# Patient Record
Sex: Female | Born: 1935 | Race: White | Hispanic: No | Smoking: Never smoker
Health system: Southern US, Community
[De-identification: ages and names within clinical notes are randomized; demographics above are authoritative.]

## PROBLEM LIST (undated history)

## (undated) DIAGNOSIS — E43 Unspecified severe protein-calorie malnutrition: Secondary | ICD-10-CM

## (undated) DIAGNOSIS — Z515 Encounter for palliative care: Principal | ICD-10-CM

## (undated) DIAGNOSIS — E46 Unspecified protein-calorie malnutrition: Secondary | ICD-10-CM

## (undated) DIAGNOSIS — R413 Other amnesia: Secondary | ICD-10-CM

## (undated) DIAGNOSIS — H9190 Unspecified hearing loss, unspecified ear: Secondary | ICD-10-CM

## (undated) HISTORY — DX: Unspecified severe protein-calorie malnutrition: E43

## (undated) HISTORY — DX: Encounter for palliative care: Z51.5

## (undated) HISTORY — PX: ABDOMINAL HYSTERECTOMY: SUR658

## (undated) HISTORY — DX: Unspecified protein-calorie malnutrition: E46

## (undated) HISTORY — DX: Other amnesia: R41.3

---

## 2007-03-10 ENCOUNTER — Other Ambulatory Visit: Payer: Self-pay

## 2007-03-11 ENCOUNTER — Inpatient Hospital Stay: Payer: Self-pay | Admitting: General Practice

## 2007-03-16 ENCOUNTER — Encounter: Payer: Self-pay | Admitting: Internal Medicine

## 2007-03-18 ENCOUNTER — Encounter: Payer: Self-pay | Admitting: Internal Medicine

## 2007-04-18 ENCOUNTER — Encounter: Payer: Self-pay | Admitting: Internal Medicine

## 2008-04-04 ENCOUNTER — Ambulatory Visit: Payer: Self-pay | Admitting: Family Medicine

## 2010-08-28 ENCOUNTER — Ambulatory Visit: Payer: Self-pay | Admitting: Ophthalmology

## 2010-08-28 DIAGNOSIS — I119 Hypertensive heart disease without heart failure: Secondary | ICD-10-CM

## 2010-09-09 ENCOUNTER — Ambulatory Visit: Payer: Self-pay | Admitting: Ophthalmology

## 2015-10-07 ENCOUNTER — Encounter: Payer: Self-pay | Admitting: Emergency Medicine

## 2015-10-07 ENCOUNTER — Emergency Department
Admission: EM | Admit: 2015-10-07 | Discharge: 2015-10-07 | Disposition: A | Discharge status: Skilled Nursing Facility | Payer: Medicare Other | Attending: Emergency Medicine | Admitting: Emergency Medicine

## 2015-10-07 ENCOUNTER — Emergency Department: Payer: Medicare Other

## 2015-10-07 DIAGNOSIS — Z5181 Encounter for therapeutic drug level monitoring: Secondary | ICD-10-CM | POA: Diagnosis not present

## 2015-10-07 DIAGNOSIS — Y999 Unspecified external cause status: Secondary | ICD-10-CM | POA: Insufficient documentation

## 2015-10-07 DIAGNOSIS — Y939 Activity, unspecified: Secondary | ICD-10-CM | POA: Insufficient documentation

## 2015-10-07 DIAGNOSIS — W19XXXA Unspecified fall, initial encounter: Secondary | ICD-10-CM | POA: Insufficient documentation

## 2015-10-07 DIAGNOSIS — S0181XA Laceration without foreign body of other part of head, initial encounter: Secondary | ICD-10-CM | POA: Insufficient documentation

## 2015-10-07 DIAGNOSIS — Y929 Unspecified place or not applicable: Secondary | ICD-10-CM | POA: Insufficient documentation

## 2015-10-07 DIAGNOSIS — S0990XA Unspecified injury of head, initial encounter: Secondary | ICD-10-CM | POA: Diagnosis present

## 2015-10-07 DIAGNOSIS — IMO0002 Reserved for concepts with insufficient information to code with codable children: Secondary | ICD-10-CM

## 2015-10-07 LAB — CBC WITH DIFFERENTIAL/PLATELET
BASOS PCT: 0 %
Basophils Absolute: 0 10*3/uL (ref 0–0.1)
EOS ABS: 0.1 10*3/uL (ref 0–0.7)
Eosinophils Relative: 2 %
HCT: 37.3 % (ref 35.0–47.0)
HEMOGLOBIN: 13.2 g/dL (ref 12.0–16.0)
Lymphocytes Relative: 19 %
Lymphs Abs: 1 10*3/uL (ref 1.0–3.6)
MCH: 36.5 pg — ABNORMAL HIGH (ref 26.0–34.0)
MCHC: 35.5 g/dL (ref 32.0–36.0)
MCV: 102.9 fL — ABNORMAL HIGH (ref 80.0–100.0)
Monocytes Absolute: 0.5 10*3/uL (ref 0.2–0.9)
Monocytes Relative: 9 %
NEUTROS PCT: 70 %
Neutro Abs: 3.7 10*3/uL (ref 1.4–6.5)
Platelets: 136 10*3/uL — ABNORMAL LOW (ref 150–440)
RBC: 3.63 MIL/uL — AB (ref 3.80–5.20)
RDW: 14.5 % (ref 11.5–14.5)
WBC: 5.2 10*3/uL (ref 3.6–11.0)

## 2015-10-07 LAB — BASIC METABOLIC PANEL
ANION GAP: 7 (ref 5–15)
BUN: 30 mg/dL — ABNORMAL HIGH (ref 6–20)
CO2: 30 mmol/L (ref 22–32)
Calcium: 9 mg/dL (ref 8.9–10.3)
Chloride: 104 mmol/L (ref 101–111)
Creatinine, Ser: 0.55 mg/dL (ref 0.44–1.00)
GFR calc Af Amer: >60 mL/min (ref >60)
Glucose, Bld: 119 mg/dL — ABNORMAL HIGH (ref 65–99)
POTASSIUM: 4 mmol/L (ref 3.5–5.1)
SODIUM: 141 mmol/L (ref 135–145)

## 2015-10-07 LAB — PROTIME-INR
INR: 1.03
PROTHROMBIN TIME: 13.7 s (ref 11.4–15.0)

## 2015-10-07 MED ORDER — LIDOCAINE-EPINEPHRINE 2 %-1:100000 IJ SOLN
20.0000 mL | Freq: Once | INTRAMUSCULAR | Status: AC
  Administered 2015-10-07: 20 mL
  Filled 2015-10-07: qty 20

## 2015-10-07 MED ORDER — LIDOCAINE-EPINEPHRINE (PF) 1 %-1:200000 IJ SOLN
INTRAMUSCULAR | Status: AC
  Filled 2015-10-07: qty 30

## 2015-10-07 NOTE — ED Notes (Signed)
Dressing changed d/t saturation.

## 2015-10-07 NOTE — ED Notes (Signed)
[facility] staff state they think pt is not taking any medications at this time because she does not have any nursing of med tech staff that distribute meds to her. State pt does not keep any medications in room.

## 2015-10-07 NOTE — ED Notes (Signed)
Patient transported to CT 

## 2015-10-07 NOTE — ED Triage Notes (Signed)
Pt presents to ED via EMS from [facility] [place] c/o fall with laceration to L temple. Pt has bled through 2 dressings with EMS PTA. Approx. 1cm skin tear noted. Pressure dressing applied. No blood thinners noted on pt's MAR, however SNF staff state MAR is not up to date because pt is in independent living. Pt is not sure what she takes and does not remember fall. No c/o pain at this time.

## 2015-10-07 NOTE — ED Notes (Signed)
Report given to next shift RN Jordan L. 

## 2015-10-07 NOTE — ED Provider Notes (Signed)
[facility] Emergency Department Provider Note  ____________________________________________   I have reviewed the triage vital signs and the nursing notes.   HISTORY  Chief Complaint Fall and Laceration    HPI Norma Perez is a 82 y.o. female who take a fall this morning. Unknown loss of consciousness. She feels to be at her baseline. Patient states normally she cannot open her right eye. She has a laceration above her left eye. She has no other complaints. She does not exactly remember the fall.    *   History reviewed. No pertinent past medical history.  There are no active problems to display for this patient.   No past surgical history on file.    Allergies Review of patient's allergies indicates no known allergies.  History reviewed. No pertinent family history.  Social History Social History  Substance Use Topics  . Smoking status: Not on file  . Smokeless tobacco: Not on file  . Alcohol use Not on file    Review of Systems Constitutional: No fever/chills Eyes: No visual changes. ENT: No sore throat. No stiff neck no neck pain Cardiovascular: Denies chest pain. Respiratory: Denies shortness of breath. Gastrointestinal:   no vomiting.  No diarrhea.  No constipation. Genitourinary: Negative for dysuria. Musculoskeletal: Negative lower extremity swelling Skin: Negative for rash. Neurological: Negative for severe headaches, focal weakness or numbness. 10-point ROS otherwise negative.  ____________________________________________   PHYSICAL EXAM:  VITAL SIGNS: ED Triage Vitals [10/07/15 1018]  Enc Vitals Group     BP (!) 153/86     Pulse Rate 85     Resp (!) 23     Temp 98.3 F (36.8 C)     Temp Source Oral     SpO2 98 %     Weight 109 lb 6.4 oz (49.6 kg)     Height 5\' 2"  (1.575 m)     Head Circumference      Peak Flow      Pain Score      Pain Loc      Pain Edu?      Excl. in GC?     Constitutional: Alert  and orientedTo name and place unsure of the date. Well appearing and in no acute distress. Eyes: Patient's left eye is normal has difficulty raising the right eyebrow at baseline she states Head: There is a T-shaped laceration to the left forehead which is 1x 1.5 cm bleeding controlled this time no skull fracture noted. Nose: No congestion/rhinnorhea. Mouth/Throat: Mucous membranes are moist.  Oropharynx non-erythematous. Neck: No stridor.   Nontender with no meningismus Cardiovascular: Normal rate, regular rhythm. Grossly normal heart sounds.  Good peripheral circulation. Respiratory: Normal respiratory effort.  No retractions. Lungs CTAB. Abdominal: Soft and nontender. No distention. No guarding no rebound Back:  There is no focal tenderness or step off.  there is no midline tenderness there are no lesions noted. there is no CVA tenderness Musculoskeletal: No lower extremity tenderness, no upper extremity tenderness. No joint effusions, no DVT signs strong distal pulses no edema Neurologic:  Normal speech and language. No gross focal neurologic deficits are appreciated aside from what patient describes her baseline difficulty with her right eye Skin:  Skin is warm, dry and intact. No rash noted. Psychiatric: Mood and affect are normal. Speech and behavior are normal.  ____________________________________________   LABS (all labs ordered are listed, but only abnormal results are displayed)  Labs Reviewed  BASIC METABOLIC PANEL - Abnormal; Notable for the following:  Result Value   Glucose, Bld 119 (*)    BUN 30 (*)    All other components within normal limits  CBC WITH DIFFERENTIAL/PLATELET - Abnormal; Notable for the following:    RBC 3.63 (*)    MCV 102.9 (*)    MCH 36.5 (*)    Platelets 136 (*)    All other components within normal limits  PROTIME-INR   ____________________________________________  EKG  I personally interpreted any EKGs ordered by me or triage Sinus  rhythm rate 82 bpm, no acute ST elevation or depression, nonspecific ST T changes noted, repolarization abnormality noted. ____________________________________________  RADIOLOGY  I reviewed any imaging ordered by me or triage that were performed during my shift and, if possible, patient and/or family made aware of any abnormal findings. ____________________________________________   PROCEDURES  Procedure(s) performed: None  .Marland KitchenLaceration Repair Date/Time: 10/07/2015 12:56 PM Performed by: Jeanmarie Plant Authorized by: Ileana Roup A   Consent:    Consent obtained:  Verbal   Consent given by:  Patient Laceration details:    Location:  Face   Wound length (cm): 1.6. Repair type:    Repair type:  Simple Pre-procedure details:    Preparation:  Patient was prepped and draped in usual sterile fashion and imaging obtained to evaluate for foreign bodies Exploration:    Hemostasis achieved with:  Direct pressure   Contaminated: no   Treatment:    Area cleansed with:  Betadine   Amount of cleaning:  Standard   Irrigation solution:  Sterile saline   Irrigation method:  Syringe   Visualized foreign bodies/material removed: no   Skin repair:    Repair method:  Sutures   Suture size:  5-0   Suture material:  Nylon   Number of sutures:  3 Approximation:    Approximation:  Close   Vermilion border: well-aligned   Post-procedure details:    Dressing:  Antibiotic ointment   Patient tolerance of procedure:  Tolerated well, no immediate complications    Critical Care performed: None  ____________________________________________   INITIAL IMPRESSION / ASSESSMENT AND PLAN / ED COURSE  Pertinent labs & imaging results that were available during my care of the patient were reviewed by me and considered in my medical decision making (see chart for details).  Patient here after a fall very well-appearing,  Clinical Course    ____________________________________________   FINAL CLINICAL IMPRESSION(S) / ED DIAGNOSES  Final diagnoses:  None      This chart was dictated using voice recognition software.  Despite best efforts to proofread,  errors can occur which can change meaning.      Jeanmarie Plant, MD 10/07/15 1258

## 2016-10-20 ENCOUNTER — Emergency Department: Payer: Medicare Other

## 2016-10-20 ENCOUNTER — Emergency Department
Admission: EM | Admit: 2016-10-20 | Discharge: 2016-10-20 | Disposition: A | Discharge status: Home / Self Care | Payer: Medicare Other | Attending: Emergency Medicine | Admitting: Emergency Medicine

## 2016-10-20 DIAGNOSIS — S0003XA Contusion of scalp, initial encounter: Secondary | ICD-10-CM | POA: Diagnosis not present

## 2016-10-20 DIAGNOSIS — W010XXA Fall on same level from slipping, tripping and stumbling without subsequent striking against object, initial encounter: Secondary | ICD-10-CM | POA: Insufficient documentation

## 2016-10-20 DIAGNOSIS — Y929 Unspecified place or not applicable: Secondary | ICD-10-CM | POA: Insufficient documentation

## 2016-10-20 DIAGNOSIS — Y999 Unspecified external cause status: Secondary | ICD-10-CM | POA: Diagnosis not present

## 2016-10-20 DIAGNOSIS — W19XXXA Unspecified fall, initial encounter: Secondary | ICD-10-CM

## 2016-10-20 DIAGNOSIS — Y9301 Activity, walking, marching and hiking: Secondary | ICD-10-CM | POA: Diagnosis not present

## 2016-10-20 DIAGNOSIS — T148XXA Other injury of unspecified body region, initial encounter: Secondary | ICD-10-CM

## 2016-10-20 DIAGNOSIS — S0990XA Unspecified injury of head, initial encounter: Secondary | ICD-10-CM | POA: Diagnosis present

## 2016-10-20 LAB — CBC
HEMATOCRIT: 37 % (ref 35.0–47.0)
HEMOGLOBIN: 13.2 g/dL (ref 12.0–16.0)
MCH: 38.5 pg — AB (ref 26.0–34.0)
MCHC: 35.7 g/dL (ref 32.0–36.0)
MCV: 107.7 fL — ABNORMAL HIGH (ref 80.0–100.0)
Platelets: 141 10*3/uL — ABNORMAL LOW (ref 150–440)
RBC: 3.43 MIL/uL — ABNORMAL LOW (ref 3.80–5.20)
RDW: 14.3 % (ref 11.5–14.5)
WBC: 5.1 10*3/uL (ref 3.6–11.0)

## 2016-10-20 LAB — BASIC METABOLIC PANEL
Anion gap: 7 (ref 5–15)
BUN: 17 mg/dL (ref 6–20)
CALCIUM: 8.6 mg/dL — AB (ref 8.9–10.3)
CO2: 27 mmol/L (ref 22–32)
CREATININE: 0.57 mg/dL (ref 0.44–1.00)
Chloride: 105 mmol/L (ref 101–111)
GFR calc non Af Amer: >60 mL/min (ref >60)
Glucose, Bld: 135 mg/dL — ABNORMAL HIGH (ref 65–99)
Potassium: 4 mmol/L (ref 3.5–5.1)
SODIUM: 139 mmol/L (ref 135–145)

## 2016-10-20 LAB — TROPONIN I: Troponin I: 0.03 ng/mL (ref <0.03)

## 2016-10-20 NOTE — ED Notes (Signed)
Pt in CT.

## 2016-10-20 NOTE — ED Provider Notes (Signed)
[facility] Emergency Department Provider Note   ____________________________________________   First MD Initiated Contact with Patient 10/20/16 1909     (approximate)  I have reviewed the triage vital signs and the nursing notes.   HISTORY  Chief Complaint Fall and Facial Injury    HPI Norma Perez is a 83 y.o. female reports she was walking tripped and fell striking her face on the floor.  Denies being in pain except feels sore across the forehead. Denies any injury to the neck back arms or legs. No chest pain or trouble breathing. No inciting factors. Reports she was walking lost her footing and fell 4 striking her head.  No chest pain. Takes no blood thinners.  Took some time to gather the history, but the patient is extremely hard of hearing.   No past medical history on file.  There are no active problems to display for this patient.   No past surgical history on file.  Prior to Admission medications   Not on File    Allergies Patient has no known allergies.  No family history on file.  Social History Social History  Substance Use Topics  . Smoking status: Not on file  . Smokeless tobacco: Not on file  . Alcohol use Not on file    Review of Systems Constitutional: No fever/chillsOr recent illness Eyes: No visual changes. Chronic problems with the right eye ENT: No sore throat. Cardiovascular: Denies chest pain. Respiratory: Denies shortness of breath. Gastrointestinal: No abdominal pain.  No nausea, no vomiting.  No diarrhea.  No constipation. Genitourinary: Negative for dysuria. Musculoskeletal: Negative for back pain. Skin: Negative for rash. Neurological: Negative for headaches though she states her forehead feels sore. No focal weakness or numbness.    ____________________________________________   PHYSICAL EXAM:  VITAL SIGNS: ED Triage Vitals  Enc Vitals Group     BP 10/20/16 1858 (!) 215/95     Pulse  Rate 10/20/16 1858 78     Resp 10/20/16 1858 19     Temp 10/20/16 1858 98.8 F (37.1 C)     Temp Source 10/20/16 1858 Oral     SpO2 10/20/16 1858 99 %     Weight 10/20/16 1859 125 lb (56.7 kg)     Height 10/20/16 1859 5\' 3"  (1.6 m)     Head Circumference --      Peak Flow --      Pain Score --      Pain Loc --      Pain Edu? --      Excl. in GC? --     Constitutional: Alert and oriented. Well appearing and in no acute distress.She is rather frail in appearance. Extremely hard of hearing. Eyes: Conjunctivae are normal. The right eye demonstrates exotropia which patient reports chronic and keeps her right eye closed. Head: Atraumatic except for mild hematoma with slight bleeding which is well-controlled with bandage over the left forehead and a small superficial abrasion over the left maxillary region. Nose: No congestion/rhinnorhea. Mouth/Throat: Mucous membranes are moist. Neck: No stridor.  No cervical thoracic or lumbar tenderness noted. Cardiovascular: Normal rate, regular rhythm. Grossly normal heart sounds.  Good peripheral circulation. Respiratory: Normal respiratory effort.  No retractions. Lungs CTAB. Gastrointestinal: Soft and nontender. No distention. Musculoskeletal: No lower extremity tenderness nor edema. Moves all extremities to command with good and equal strength. Neurologic:  Normal speech and language. No gross focal neurologic deficits are appreciated. Extremely hard of hearing Skin:  Skin  is warm, dry and intact. No rash noted. Psychiatric: Mood and affect are normal. Speech and behavior are normal. Patient is apologetic for having to come to the hospital.   ____________________________________________   LABS (all labs ordered are listed, but only abnormal results are displayed)  Labs Reviewed  CBC - Abnormal; Notable for the following:       Result Value   RBC 3.43 (*)    MCV 107.7 (*)    MCH 38.5 (*)    Platelets 141 (*)    All other components within  normal limits  BASIC METABOLIC PANEL - Abnormal; Notable for the following:    Glucose, Bld 135 (*)    Calcium 8.6 (*)    All other components within normal limits  TROPONIN I - Abnormal; Notable for the following:    Troponin I 0.03 (*)    All other components within normal limits   ____________________________________________  EKG  Reviewed injury by me at 1915 Heart rate 80 Care is 99 QTc 460 Normal sinus rhythm, occasional PVC, nonspecific T-wave abnormality with some mild depressions in V5 and V6. No previous comparison. Cannot exclude ischemic disease. Of note the patient complains of no chest pain or trouble breathing. ____________________________________________  RADIOLOGY  Ct Head Wo Contrast  Result Date: 10/20/2016 CLINICAL DATA:  83 y/o F; unwitnessed fall. Found on floor with facial injury and left orbital swelling. EXAM: CT HEAD WITHOUT CONTRAST CT CERVICAL SPINE WITHOUT CONTRAST TECHNIQUE: Multidetector CT imaging of the head and cervical spine was performed following the standard protocol without intravenous contrast. Multiplanar CT image reconstructions of the cervical spine were also generated. COMPARISON:  10/07/2015 CT of head and cervical spine. FINDINGS: CT HEAD FINDINGS Brain: No evidence of acute infarction, hemorrhage, hydrocephalus, extra-axial collection or mass lesion/mass effect. Stable chronic infarct right occipital lobe, chronic microvascular ischemic changes of white matter, parenchymal volume loss of the brain. Vascular: Calcific atherosclerosis of carotid siphons. Skull: Left frontal scalp contusion and laceration. No displaced calvarial fracture. Sinuses/Orbits: Strabismus, probably right lateral. Right globe staphyloma. Left intra-ocular lens replacement. Partially visualize right maxillary sinus atelectasis. Otherwise negative. Other: None. CT CERVICAL SPINE FINDINGS Alignment: Normal. Skull base and vertebrae: No acute fracture. No primary bone lesion or  focal pathologic process. Soft tissues and spinal canal: No prevertebral fluid or swelling. No visible canal hematoma. Disc levels: Cervical spondylosis with multilevel disc and facet degenerative changes. No high-grade bony canal stenosis or foraminal narrowing. Upper chest: Stable nodular biapical pleuroparenchymal scarring. Other: Mild calcific atherosclerosis of the right carotid bifurcation. IMPRESSION: CT head: 1. Left frontal scalp contusion and laceration. No displaced calvarial fracture. 2. No acute intracranial abnormality. 3. Stable chronic infarct right occipital lobe, chronic microvascular ischemic changes of white matter, parenchymal volume loss of the brain. CT cervical spine: 1. No acute fracture or dislocation of the cervical spine. 2. Mild cervical spondylosis, no high-grade bony foraminal or canal stenosis. Electronically Signed   By: Mitzi Hansen M.D.   On: 10/20/2016 20:04   Ct Cervical Spine Wo Contrast  Result Date: 10/20/2016 CLINICAL DATA:  84 y/o F; unwitnessed fall. Found on floor with facial injury and left orbital swelling. EXAM: CT HEAD WITHOUT CONTRAST CT CERVICAL SPINE WITHOUT CONTRAST TECHNIQUE: Multidetector CT imaging of the head and cervical spine was performed following the standard protocol without intravenous contrast. Multiplanar CT image reconstructions of the cervical spine were also generated. COMPARISON:  10/07/2015 CT of head and cervical spine. FINDINGS: CT HEAD FINDINGS Brain: No evidence of acute  infarction, hemorrhage, hydrocephalus, extra-axial collection or mass lesion/mass effect. Stable chronic infarct right occipital lobe, chronic microvascular ischemic changes of white matter, parenchymal volume loss of the brain. Vascular: Calcific atherosclerosis of carotid siphons. Skull: Left frontal scalp contusion and laceration. No displaced calvarial fracture. Sinuses/Orbits: Strabismus, probably right lateral. Right globe staphyloma. Left intra-ocular  lens replacement. Partially visualize right maxillary sinus atelectasis. Otherwise negative. Other: None. CT CERVICAL SPINE FINDINGS Alignment: Normal. Skull base and vertebrae: No acute fracture. No primary bone lesion or focal pathologic process. Soft tissues and spinal canal: No prevertebral fluid or swelling. No visible canal hematoma. Disc levels: Cervical spondylosis with multilevel disc and facet degenerative changes. No high-grade bony canal stenosis or foraminal narrowing. Upper chest: Stable nodular biapical pleuroparenchymal scarring. Other: Mild calcific atherosclerosis of the right carotid bifurcation. IMPRESSION: CT head: 1. Left frontal scalp contusion and laceration. No displaced calvarial fracture. 2. No acute intracranial abnormality. 3. Stable chronic infarct right occipital lobe, chronic microvascular ischemic changes of white matter, parenchymal volume loss of the brain. CT cervical spine: 1. No acute fracture or dislocation of the cervical spine. 2. Mild cervical spondylosis, no high-grade bony foraminal or canal stenosis. Electronically Signed   By: Mitzi HansenLance  Furusawa-Stratton M.D.   On: 10/20/2016 20:04    ____________________________________________   PROCEDURES  Procedure(s) performed: None  Procedures  Critical Care performed: No  ____________________________________________   INITIAL IMPRESSION / ASSESSMENT AND PLAN / ED COURSE  Pertinent labs & imaging results that were available during my care of the patient were reviewed by me and considered in my medical decision making (see chart for details).  Patient presents for evaluation after a fall. Obvious injury to the head with a laceration and small hematoma. Past medical history is not entirely clear, the patient reports being on no blood thinners. She is alert and well oriented and clearly reports tripping and falling. Denies any other associated symptoms or injury. Based on the patient's age however, I'll proceed with  CT of the head and cervical spine to evaluate for injury in the obviously injured region. In addition check basic labs and an EKG. She is notably hypertensive, but given the age of 83 and a very wide pulse pressure this may be permissive. Have called family but unable to reach him at this time.  Clinical Course as of Oct 20 2228  Mon Oct 20, 2016  1909 Called Son [place](James) at listed number. No answer. No voicemail to leave message.  [MQ]  2032 Patient sent the bedside, reports there is been in her normal mental state. Last 1-2 years she's had increasing confusion and dementia. She is awake alert and oriented to place, but slightly confused to time and date. Son reports this appears to be normal for her.  [MQ]  2102 Discussed with both patient sons. Including the patient's healthcare power of attorney and son Lorne Skeensimmy. They would like to be able to take her home, but request to have her ambulate and if she feels well are comfortable with the plan for discharge. I discussed with them or heart test is slightly abnormal, and I can't rule out a "heart problem", but the report given quality of life and her mother strong desire to return home place and that they would be most comfortable taking her home and understand that we can't exclude all problems here. The suspect she likely tripped and fell, which I would agree. Radiation shared medical decision making, each exchanged information and based on family and patient wishes as well  as my recommendations and discussion they will plan to take her home if she is able to ambulate well. I do not believe she is having acute coronary syndrome. She is denying any chest pain or trouble breathing. Her hemodynamics are stable.  [MQ]    Clinical Course User Index [MQ] Sharyn Creamer, MD   ----------------------------------------- 10:31 PM on 10/20/2016 -----------------------------------------  Forehead skin tear repaired with Dermabond with good result. Cleanse previous,  explored with no foreign body. Patient able to get up and using walker well without difficulty. She has no complaint. Discussed with the patient sons, and we will discharge her home in the care of her son who lives in the local area were taken back to her care facility.  Discharge precautions reviewed with son  ____________________________________________   FINAL CLINICAL IMPRESSION(S) / ED DIAGNOSES  Final diagnoses:  Fall, initial encounter  Hematoma of frontal scalp, initial encounter  Multiple skin tears      NEW MEDICATIONS STARTED DURING THIS VISIT:  New Prescriptions   No medications on file     Note:  This document was prepared using Dragon voice recognition software and may include unintentional dictation errors.     Sharyn Creamer, MD 10/20/16 2231

## 2016-10-20 NOTE — ED Notes (Signed)
[facility]oke [facility] at [facility] prior to discharge

## 2016-10-20 NOTE — ED Triage Notes (Signed)
Pt presents to ED post unwitnessed fall  From [facility]. Pt found on floor with facial injury,skin tear, left orbital swelling

## 2016-10-20 NOTE — Discharge Instructions (Signed)
You have been seen in the Emergency Department (ED) today for a fall.  ° °Please follow up with your doctor regarding today's Emergency Department (ED) visit and your recent fall.   ° °Return to the ED if you have any headache, confusion, slurred speech, weakness/numbness of any arm or leg, or any increased pain. ° °

## 2017-06-08 ENCOUNTER — Other Ambulatory Visit: Payer: Self-pay

## 2017-06-08 ENCOUNTER — Emergency Department: Payer: Medicare Other

## 2017-06-08 ENCOUNTER — Inpatient Hospital Stay
Admission: EM | Admit: 2017-06-08 | Discharge: 2017-06-11 | DRG: 871 | Disposition: A | Discharge status: Home Health | Payer: Medicare Other | Attending: Internal Medicine | Admitting: Internal Medicine

## 2017-06-08 DIAGNOSIS — Y95 Nosocomial condition: Secondary | ICD-10-CM | POA: Diagnosis not present

## 2017-06-08 DIAGNOSIS — A419 Sepsis, unspecified organism: Secondary | ICD-10-CM

## 2017-06-08 DIAGNOSIS — Z681 Body mass index (BMI) 19 or less, adult: Secondary | ICD-10-CM

## 2017-06-08 DIAGNOSIS — E861 Hypovolemia: Secondary | ICD-10-CM | POA: Diagnosis present

## 2017-06-08 DIAGNOSIS — F0390 Unspecified dementia without behavioral disturbance: Secondary | ICD-10-CM | POA: Diagnosis present

## 2017-06-08 DIAGNOSIS — R6520 Severe sepsis without septic shock: Secondary | ICD-10-CM | POA: Diagnosis present

## 2017-06-08 DIAGNOSIS — H547 Unspecified visual loss: Secondary | ICD-10-CM | POA: Diagnosis present

## 2017-06-08 DIAGNOSIS — J9601 Acute respiratory failure with hypoxia: Secondary | ICD-10-CM | POA: Diagnosis present

## 2017-06-08 DIAGNOSIS — E871 Hypo-osmolality and hyponatremia: Secondary | ICD-10-CM | POA: Diagnosis present

## 2017-06-08 DIAGNOSIS — R509 Fever, unspecified: Secondary | ICD-10-CM | POA: Diagnosis present

## 2017-06-08 DIAGNOSIS — E43 Unspecified severe protein-calorie malnutrition: Secondary | ICD-10-CM

## 2017-06-08 DIAGNOSIS — E860 Dehydration: Secondary | ICD-10-CM | POA: Diagnosis present

## 2017-06-08 DIAGNOSIS — G92 Toxic encephalopathy: Secondary | ICD-10-CM | POA: Diagnosis present

## 2017-06-08 DIAGNOSIS — Z90710 Acquired absence of both cervix and uterus: Secondary | ICD-10-CM

## 2017-06-08 DIAGNOSIS — Z23 Encounter for immunization: Secondary | ICD-10-CM | POA: Diagnosis present

## 2017-06-08 DIAGNOSIS — Z66 Do not resuscitate: Secondary | ICD-10-CM

## 2017-06-08 DIAGNOSIS — J189 Pneumonia, unspecified organism: Secondary | ICD-10-CM | POA: Diagnosis present

## 2017-06-08 DIAGNOSIS — H9190 Unspecified hearing loss, unspecified ear: Secondary | ICD-10-CM | POA: Diagnosis present

## 2017-06-08 DIAGNOSIS — I248 Other forms of acute ischemic heart disease: Secondary | ICD-10-CM | POA: Diagnosis present

## 2017-06-08 DIAGNOSIS — A409 Streptococcal sepsis, unspecified: Principal | ICD-10-CM | POA: Diagnosis present

## 2017-06-08 HISTORY — DX: Unspecified hearing loss, unspecified ear: H91.90

## 2017-06-08 LAB — CBC WITH DIFFERENTIAL/PLATELET
BAND NEUTROPHILS: 0 %
Basophils Absolute: 0 10*3/uL (ref 0–0.1)
Basophils Relative: 0 %
Blasts: 0 %
EOS ABS: 0 10*3/uL (ref 0–0.7)
EOS PCT: 0 %
HEMATOCRIT: 35.1 % (ref 35.0–47.0)
Hemoglobin: 12.3 g/dL (ref 12.0–16.0)
LYMPHS ABS: 0.6 10*3/uL — AB (ref 1.0–3.6)
LYMPHS PCT: 15 %
MCH: 40.7 pg — ABNORMAL HIGH (ref 26.0–34.0)
MCHC: 35.2 g/dL (ref 32.0–36.0)
MCV: 115.5 fL — ABNORMAL HIGH (ref 80.0–100.0)
MONOS PCT: 4 %
Metamyelocytes Relative: 1 %
Monocytes Absolute: 0.2 10*3/uL (ref 0.2–0.9)
Myelocytes: 0 %
NEUTROS ABS: 3.3 10*3/uL (ref 1.4–6.5)
NEUTROS PCT: 80 %
NRBC: 0 /100{WBCs}
Other: 0 %
PLATELETS: 151 10*3/uL (ref 150–440)
PROMYELOCYTES ABS: 0 %
RBC: 3.03 MIL/uL — ABNORMAL LOW (ref 3.80–5.20)
RDW: 15.1 % — ABNORMAL HIGH (ref 11.5–14.5)
WBC: 4.1 10*3/uL (ref 3.6–11.0)

## 2017-06-08 LAB — COMPREHENSIVE METABOLIC PANEL
ALBUMIN: 3.4 g/dL — AB (ref 3.5–5.0)
ALT: 14 U/L (ref 14–54)
AST: 33 U/L (ref 15–41)
Alkaline Phosphatase: 48 U/L (ref 38–126)
Anion gap: 12 (ref 5–15)
BUN: 28 mg/dL — AB (ref 6–20)
CHLORIDE: 96 mmol/L — AB (ref 101–111)
CO2: 26 mmol/L (ref 22–32)
CREATININE: 0.63 mg/dL (ref 0.44–1.00)
Calcium: 8.8 mg/dL — ABNORMAL LOW (ref 8.9–10.3)
GFR calc Af Amer: >60 mL/min (ref >60)
GFR calc non Af Amer: >60 mL/min (ref >60)
GLUCOSE: 129 mg/dL — AB (ref 65–99)
POTASSIUM: 3.6 mmol/L (ref 3.5–5.1)
Sodium: 134 mmol/L — ABNORMAL LOW (ref 135–145)
Total Bilirubin: 2.1 mg/dL — ABNORMAL HIGH (ref 0.3–1.2)
Total Protein: 7.4 g/dL (ref 6.5–8.1)

## 2017-06-08 LAB — URINALYSIS, COMPLETE (UACMP) WITH MICROSCOPIC
BILIRUBIN URINE: NEGATIVE
Glucose, UA: NEGATIVE mg/dL
Ketones, ur: 20 mg/dL — AB
NITRITE: NEGATIVE
PH: 5 (ref 5.0–8.0)
Protein, ur: 100 mg/dL — AB
SPECIFIC GRAVITY, URINE: 1.018 (ref 1.005–1.030)
Squamous Epithelial / LPF: NONE SEEN

## 2017-06-08 LAB — LACTIC ACID, PLASMA
LACTIC ACID, VENOUS: 1.6 mmol/L (ref 0.5–1.9)
Lactic Acid, Venous: 2.7 mmol/L (ref 0.5–1.9)

## 2017-06-08 LAB — INFLUENZA PANEL BY PCR (TYPE A & B)
Influenza A By PCR: NEGATIVE
Influenza B By PCR: NEGATIVE

## 2017-06-08 LAB — BRAIN NATRIURETIC PEPTIDE: B Natriuretic Peptide: 448 pg/mL — ABNORMAL HIGH (ref 0.0–100.0)

## 2017-06-08 LAB — LIPASE, BLOOD: Lipase: 30 U/L (ref 11–51)

## 2017-06-08 LAB — PROCALCITONIN: PROCALCITONIN: 0.38 ng/mL

## 2017-06-08 LAB — TROPONIN I: TROPONIN I: 0.05 ng/mL — AB (ref <0.03)

## 2017-06-08 MED ORDER — SODIUM CHLORIDE 0.9 % IV SOLN
INTRAVENOUS | Status: DC
  Administered 2017-06-08: 23:00:00 via INTRAVENOUS

## 2017-06-08 MED ORDER — ACETAMINOPHEN 650 MG RE SUPP: 650.0000 mg | Freq: Four times a day (QID) | RECTAL | Status: DC | PRN

## 2017-06-08 MED ORDER — ACETAMINOPHEN 325 MG PO TABS: 650.0000 mg | ORAL_TABLET | Freq: Four times a day (QID) | ORAL | Status: DC | PRN

## 2017-06-08 MED ORDER — SODIUM CHLORIDE 0.9 % IV SOLN
2.0000 g | Freq: Once | INTRAVENOUS | Status: AC
  Administered 2017-06-08: 2 g via INTRAVENOUS
  Filled 2017-06-08: qty 2

## 2017-06-08 MED ORDER — ONDANSETRON HCL 4 MG PO TABS: 4.0000 mg | ORAL_TABLET | Freq: Four times a day (QID) | ORAL | Status: DC | PRN

## 2017-06-08 MED ORDER — ENOXAPARIN SODIUM 40 MG/0.4ML ~~LOC~~ SOLN: 40.0000 mg | SUBCUTANEOUS | Status: DC

## 2017-06-08 MED ORDER — SENNOSIDES-DOCUSATE SODIUM 8.6-50 MG PO TABS: 1.0000 | ORAL_TABLET | Freq: Every evening | ORAL | Status: DC | PRN

## 2017-06-08 MED ORDER — SODIUM CHLORIDE 0.9 % IV BOLUS
500.0000 mL | Freq: Once | INTRAVENOUS | Status: AC
  Administered 2017-06-08: 500 mL via INTRAVENOUS

## 2017-06-08 MED ORDER — VANCOMYCIN HCL IN DEXTROSE 1-5 GM/200ML-% IV SOLN
1000.0000 mg | Freq: Once | INTRAVENOUS | Status: AC
Start: 2017-06-08 — End: 2017-06-08
  Administered 2017-06-08: 1000 mg via INTRAVENOUS
  Filled 2017-06-08: qty 200

## 2017-06-08 MED ORDER — ONDANSETRON HCL 4 MG/2ML IJ SOLN: 4.0000 mg | Freq: Four times a day (QID) | INTRAMUSCULAR | Status: DC | PRN

## 2017-06-08 MED ORDER — SODIUM CHLORIDE 0.9 % IV BOLUS (SEPSIS)
500.0000 mL | Freq: Once | INTRAVENOUS | Status: AC
  Administered 2017-06-08: 500 mL via INTRAVENOUS

## 2017-06-08 MED ORDER — PNEUMOCOCCAL VAC POLYVALENT 25 MCG/0.5ML IJ INJ
0.5000 mL | INJECTION | INTRAMUSCULAR | Status: AC
  Administered 2017-06-11: 10:00:00 0.5 mL via INTRAMUSCULAR
  Filled 2017-06-08: qty 0.5

## 2017-06-08 MED ORDER — VANCOMYCIN HCL 500 MG IV SOLR
500.0000 mg | INTRAVENOUS | Status: DC
  Filled 2017-06-08: qty 500

## 2017-06-08 MED ORDER — SODIUM CHLORIDE 0.9 % IV SOLN
2.0000 g | Freq: Two times a day (BID) | INTRAVENOUS | Status: DC
  Filled 2017-06-08 (×2): qty 2

## 2017-06-08 MED ORDER — HALOPERIDOL LACTATE 5 MG/ML IJ SOLN: 1.0000 mg | Freq: Four times a day (QID) | INTRAMUSCULAR | Status: DC | PRN

## 2017-06-08 MED ORDER — ENOXAPARIN SODIUM 30 MG/0.3ML ~~LOC~~ SOLN
30.0000 mg | SUBCUTANEOUS | Status: DC
  Administered 2017-06-09 – 2017-06-10 (×2): 30 mg via SUBCUTANEOUS
  Filled 2017-06-08 (×2): qty 0.3

## 2017-06-08 MED ORDER — SODIUM CHLORIDE 0.9 % IV BOLUS (SEPSIS)
1000.0000 mL | Freq: Once | INTRAVENOUS | Status: AC
  Administered 2017-06-08: 1000 mL via INTRAVENOUS

## 2017-06-08 NOTE — Progress Notes (Signed)
Lovenox changed to 30 mg daily for CrCl <30 and BMI <40. 

## 2017-06-08 NOTE — ED Notes (Signed)
Pt desat'd to 77% on ra with good waveform - placed on 2L via n/c improved to 83% - increased to 4L increased to 94%

## 2017-06-08 NOTE — Progress Notes (Signed)
Advanced care plan.  Purpose of the Encounter: CODE STATUS  Parties in Attendance: patient and family  Patient's Decision Capacity: Not good Family was present for discussion  Subjective/Patient's story: Presented to the emergency room for fever cough, congestion and difficulty breathing  Objective/Medical story Has pneumonia in both the lungs and sepsis   Goals of care determination: Advance directives were discussed Family does not want any cardiac resuscitation, intubation and ventilator management if the need arises. Patient is DNR by CODE STATUS  CODE STATUS: DNR   Time spent discussing advanced care planning: 16 minutes

## 2017-06-08 NOTE — ED Triage Notes (Signed)
Pt arrived via ems for c/o fever that started today - max 102.5 - [facility] and has been exposed to +flu this week - her roommate also has pneumonia

## 2017-06-08 NOTE — Progress Notes (Signed)
ANTIBIOTIC CONSULT NOTE - INITIAL  Pharmacy Consult for Vancomycin, Cefepime  Indication: pneumonia  No Known Allergies  Patient Measurements: Height: 5\' 5"  (165.1 cm) Weight: 108 lb (49 kg) IBW/kg (Calculated) : 57 Adjusted Body Weight:  53.8 kg   Vital Signs: Temp: 98.2 F (36.8 C) (03/25 2215) Temp Source: Oral (03/25 2215) BP: 147/99 (03/25 2215) Pulse Rate: 109 (03/25 2215) Intake/Output from previous day: No intake/output data recorded. Intake/Output from this shift: Total I/O In: 1800 [IV Piggyback:1800] Out: -   Labs: Recent Labs    06/08/17 1954  WBC 4.1  HGB 12.3  PLT 151  CREATININE 0.63   Estimated Creatinine Clearance: 29.6 mL/min (by C-G formula based on SCr of 0.63 mg/dL). No results for input(s): VANCOTROUGH, VANCOPEAK, VANCORANDOM, GENTTROUGH, GENTPEAK, GENTRANDOM, TOBRATROUGH, TOBRAPEAK, TOBRARND, AMIKACINPEAK, AMIKACINTROU, AMIKACIN in the last 72 hours.   Microbiology: No results found for this or any previous visit (from the past 720 hour(s)).  Medical History: History reviewed. No pertinent past medical history.  Medications:  No medications prior to admission.   Assessment: CrCl = 29.6 ml/min  ke = 0.029 hr-1 T1/2 = 23.9 hrs VD = 34.3 L   Goal of Therapy:  Vancomycin trough level 15-20 mcg/ml  Plan:  Expected duration 7 days with resolution of temperature and/or normalization of WBC   Vancomycin 1 gm IV X 1 given on 3/25 @ 21:00.  Vancomycin 500 mg IV Q18H ordered to start on 3/26 @ 13:00, ~ 18 hrs after 1st dose (stacked dosing). This pt will reach Css by 3/30 @ 21:00. Will draw 1st trough on 3/29 @ 12:30, which will be approaching Css.   Cefepime 2 gm IV X 1 given on 3/25 @ 20:30. Cefepime 2 gm IV Q12H ordered to start on 3/26 @ 0800.   Rebeca Valdivia D 06/08/2017,10:16 PM

## 2017-06-08 NOTE — ED Provider Notes (Signed)
[facility] Emergency Department Provider Note  ____________________________________________   First MD Initiated Contact with Patient 06/08/17 1958     (approximate)  I have reviewed the triage vital signs and the nursing notes.   HISTORY  Chief Complaint Fever  Level 5 caveat:  history/ROS limited by acute/critical illness  HPI Norma Perez is a 84 y.o. female who, according to her family, has no significant chronic medical issues and who has been remarkably healthy for her age.  She presents by EMS for evaluation of fever.  She reportedly has been exposed to influenza.  She lives at a nursing facility but her family is with her regularly.  They report that she seems to have been declining in health relatively rapidly recently but they are hard pressed to identify exactly what symptoms she is having other than generalized weakness.  The patient was febrile to 103 and tachycardic upon arrival and I made her code sepsis (this is before the family arrived).  No additional history is available except that she seems to have gotten ill rather rapidly and her symptoms are severe, nothing in particular seems to make them better nor worse.  History reviewed. No pertinent past medical history.  Patient Active Problem List   Diagnosis Date Noted  . Pneumonia 06/08/2017    Past Surgical History:  Procedure Laterality Date  . ABDOMINAL HYSTERECTOMY      Prior to Admission medications   Not on File    Allergies Patient has no known allergies.  No family history on file.  Social History Social History   Tobacco Use  . Smoking status: Never Smoker  . Smokeless tobacco: Never Used  Substance Use Topics  . Alcohol use: Never    Frequency: Never  . Drug use: Never    Review of Systems Level 5 caveat:  history/ROS limited by acute/critical illness  ____________________________________________   PHYSICAL EXAM:  VITAL SIGNS: ED Triage Vitals    Enc Vitals Group     BP 06/08/17 1949 (!) 170/130     Pulse Rate 06/08/17 1949 (!) 109     Resp 06/08/17 1949 20     Temp 06/08/17 1949 (!) 103 F (39.4 C)     Temp Source 06/08/17 1949 Oral     SpO2 06/08/17 1949 95 %     Weight 06/08/17 1947 49 kg (108 lb)     Height 06/08/17 1947 1.651 m (_0 )     Head Circumference --      Peak Flow --      Pain Score 06/08/17 1947 0     Pain Loc --      Pain Edu? --      Excl. in [place]? 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 Nose: No congestion/rhinnorhea. Mouth/Throat: Mucous membranes are dry Neck: No stridor.  No meningeal signs.   Cardiovascular: Tachycardia, regular rhythm. Good peripheral circulation. Grossly normal heart sounds. Respiratory: Increased respiratory rate but no retractions.  Coarse breath sounds throughout. Gastrointestinal: Soft and nontender. No distention.  Musculoskeletal: No lower extremity tenderness nor edema. No gross deformities of extremities. Neurologic: Patient is unable to participate in neurological exam but she is moving all 4 extremities. Skin:  Skin is warm, dry and intact. No rash noted.   ____________________________________________  LABS (all labs ordered are listed, but only abnormal results are displayed)  Labs Reviewed  COMPREHENSIVE METABOLIC PANEL - Abnormal; Notable for the following components:      Result Value   Sodium 134 (*)    Chloride 96 (*)    Glucose, Bld 129 (*)    BUN 28 (*)    Calcium 8.8 (*)    Albumin 3.4 (*)    Total Bilirubin 2.1 (*)    All other components within normal limits  LACTIC ACID, PLASMA - Abnormal; Notable for the following components:   Lactic Acid, Venous 2.7 (*)    All other components within normal limits  CBC WITH  DIFFERENTIAL/PLATELET - Abnormal; Notable for the following components:   RBC 3.03 (*)    MCV 115.5 (*)    MCH 40.7 (*)    RDW 15.1 (*)    Lymphs Abs 0.6 (*)    All other components within normal limits  URINALYSIS, COMPLETE (UACMP) WITH MICROSCOPIC - Abnormal; Notable for the following components:   Color, Urine AMBER (*)    APPearance CLOUDY (*)    Hgb urine dipstick MODERATE (*)    Ketones, ur 20 (*)    Protein, ur 100 (*)    Leukocytes, UA SMALL (*)    Bacteria, UA FEW (*)    All other components within normal limits  BRAIN NATRIURETIC PEPTIDE - Abnormal; Notable for the following components:   B Natriuretic Peptide 448.0 (*)    All other components within normal limits  TROPONIN I - Abnormal; Notable for the following components:   Troponin I 0.05 (*)    All other components within normal limits  TROPONIN I - Abnormal; Notable for the following components:   Troponin I 0.06 (*)    All other components within normal limits  LACTIC ACID, PLASMA - Abnormal; Notable for the following components:   Lactic Acid, Venous 2.0 (*)    All other components within normal limits  MRSA PCR SCREENING  CULTURE, BLOOD (ROUTINE X 2)  CULTURE, BLOOD (ROUTINE X 2)  URINE CULTURE  LACTIC ACID, PLASMA  LIPASE, BLOOD  PROCALCITONIN  INFLUENZA PANEL BY PCR (TYPE A & B)  PATHOLOGIST SMEAR REVIEW  BASIC METABOLIC PANEL  CBC  TROPONIN I  TROPONIN I  LACTIC ACID, PLASMA   ____________________________________________  EKG  ED ECG REPORT I, Hinda Kehr, the attending physician, personally viewed and interpreted this ECG.  Date: 06/08/2017 EKG Time: 20: 03 Rate: 112 Rhythm: Sinus tachycardia with multiple premature complexes QRS Axis: normal Intervals: normal ST/T Wave abnormalities: Non-specific ST segment / T-wave changes, but no evidence of acute ischemia. Narrative Interpretation: no evidence of acute ischemia   ____________________________________________  RADIOLOGY I, Hinda Kehr, personally viewed and evaluated these images (plain radiographs) as part of my medical decision making, as well as reviewing the written report by the radiologist.  ED MD interpretation: Multi lobar pneumonia  Official radiology report(s): Dg Chest Port 1 View  Result Date: 06/08/2017 CLINICAL DATA:  Code sepsis. EXAM: PORTABLE CHEST 1 VIEW COMPARISON:  03/10/2007 FINDINGS: There is consolidation at the left lung base obscuring the left hemidiaphragm and portions of the left heart border. Thickened bronchovascular markings and reticular opacities are noted at the right lung base. These findings are new from the prior exam. There is reticular type scarring in both upper lobes extending to the apices, stable from the prior exam. No evidence of pulmonary edema. Suspect some pleural fluid on the left. Possible minimal right pleural  effusion. No pneumothorax. Cardiac silhouette is normal in size. No mediastinal or hilar masses. Skeletal structures are demineralized but grossly intact. IMPRESSION: 1. Left lung base confluent opacity consistent with pneumonia. 2. Right lung base prominent bronchovascular markings and reticular type opacities. Additional pneumonia is suspected. Electronically Signed   By: Lajean Manes M.D.   On: 06/08/2017 20:24    ____________________________________________   PROCEDURES  Critical Care performed: Yes, see critical care procedure note(s)   Procedure(s) performed:   .Critical Care Performed by: Hinda Kehr, MD Authorized by: Hinda Kehr, MD   Critical care provider statement:    Critical care time (minutes):  30   Critical care time was exclusive of:  Separately billable procedures and treating other patients   Critical care was necessary to treat or prevent imminent or life-threatening deterioration of the following conditions:  Sepsis   Critical care was time spent personally by me on the following activities:  Development of treatment plan with  patient or surrogate, discussions with consultants, evaluation of patient's response to treatment, examination of patient, obtaining history from patient or surrogate, ordering and performing treatments and interventions, ordering and review of laboratory studies, ordering and review of radiographic studies, pulse oximetry, re-evaluation of patient's condition and review of old charts     ____________________________________________   INITIAL IMPRESSION / [place] / ED COURSE  As part of my medical decision making, I reviewed the following data within the electronic MEDICAL RECORD NUMBER History obtained from family, Nursing notes reviewed and incorporated, Labs reviewed , EKG interpreted , Old chart reviewed, Radiograph reviewed , Discussed with admitting physician  and Notes from prior ED visits    Differential diagnosis includes, but is not limited to, healthcare associated pneumonia given that she was at a nursing facility, viral illness including influenza, urinary tract infection, bacteremia, much less likely CVA or ACS.  The patient met septic criteria upon arrival and I did make her a code sepsis including empiric IV fluids and empiric broad-spectrum antibiotics (I chose cefepime 2 g IV and vancomycin 1 g IV given that I was treating healthcare associated pneumonia to the best of my expectations).  I discussed with the patient and her family that I am very concerned about her prognosis; her lab work was notable for a questionable urinary tract infection But she has an elevated lactic acid and appears to have multi lobar pneumonia on chest x-ray.  I confirmed with the patient's family that she should be DNR/DNI and she did already have paperwork available to that effect.  I put the order in the computer to reflect their decision as well.  I discussed the case in person with Dr. Estanislado Pandy with the hospitalist service who will admit for further management.       ____________________________________________  FINAL CLINICAL IMPRESSION(S) / ED DIAGNOSES  Final diagnoses:  Sepsis, due to unspecified organism [facility])  Community acquired pneumonia, unspecified laterality  DNR (do not resuscitate)     MEDICATIONS GIVEN DURING THIS VISIT:  Medications  0.9 %  sodium chloride infusion ( Intravenous Rate/Dose Change 06/08/17 2353)  acetaminophen (TYLENOL) tablet 650 mg (has no administration in time range)    Or  acetaminophen (TYLENOL) suppository 650 mg (has no administration in time range)  ondansetron (ZOFRAN) tablet 4 mg (has no administration in time range)    Or  ondansetron (ZOFRAN) injection 4 mg (has no administration in time range)  senna-docusate (Senokot-S) tablet 1 tablet (has no administration in time range)  vancomycin (VANCOCIN)  500 mg in sodium chloride 0.9 % 100 mL IVPB (has no administration in time range)  ceFEPIme (MAXIPIME) 2 g in sodium chloride 0.9 % 100 mL IVPB (has no administration in time range)  pneumococcal 23 valent vaccine (PNU-IMMUNE) injection 0.5 mL (has no administration in time range)  haloperidol lactate (HALDOL) injection 1 mg (has no administration in time range)  enoxaparin (LOVENOX) injection 30 mg (has no administration in time range)  MEDLINE mouth rinse (has no administration in time range)  sodium chloride 0.9 % bolus 1,000 mL (0 mLs Intravenous Stopped 06/08/17 2119)  ceFEPIme (MAXIPIME) 2 g in sodium chloride 0.9 % 100 mL IVPB (0 g Intravenous Stopped 06/08/17 2112)  vancomycin (VANCOCIN) IVPB 1000 mg/200 mL premix (0 mg Intravenous Stopped 06/08/17 2157)  sodium chloride 0.9 % bolus 500 mL (0 mLs Intravenous Stopped 06/08/17 2158)  sodium chloride 0.9 % bolus 500 mL (500 mLs Intravenous Given 06/08/17 2353)     ED Discharge Orders    None       Note:  This document was prepared using Dragon voice recognition software and may include unintentional dictation errors.    Hinda Kehr, MD 06/09/17  [phone]

## 2017-06-08 NOTE — H&P (Addendum)
[facility] - [place] at [facility]   PATIENT NAME: Norma Perez    MR#:  [id number]  DATE OF BIRTH:  [birth year]  DATE OF ADMISSION:  06/08/2017  PRIMARY CARE PHYSICIAN: Patient, No Pcp Per   REQUESTING/REFERRING PHYSICIAN:   CHIEF COMPLAINT:   Chief Complaint  Patient presents with  . Fever    HISTORY OF PRESENT ILLNESS: Norma Perez  is a 84 y.o. female with significant past medical history he is a resident of home place of [place] was brought by EMS for fever of 102.5 degrees Fahrenheit.  Patient appears lethargic and weak she has cough productive of phlegm.. She also has fever and chills.  Her roommate had flu and pneumonia.  Flu testing is pending in the emergency room.  Patient was given IV fluid bolus and started on IV antibiotics.  She was worked up with chest x-ray which showed bilateral pneumonia.  Patient is ambulatory with the help of walker at the [facility].  She is dependent on instrumental activities of daily living.  Patient's family do not want any cardiac resuscitation and intubation if the need arises.  PAST MEDICAL HISTORY:  No history of CAD, hypertension, diabetes, copd  PAST SURGICAL HISTORY:  Past Surgical History:  Procedure Laterality Date  . ABDOMINAL HYSTERECTOMY      SOCIAL HISTORY:  Social History   Tobacco Use  . Smoking status: Never Smoker  . Smokeless tobacco: Never Used  Substance Use Topics  . Alcohol use: Never    Frequency: Never    FAMILY HISTORY: Mother and father deceased No history of copd, diabetes. cad  DRUG ALLERGIES: No Known Allergies  REVIEW OF SYSTEMS:  Could not obtain completely as patient is confused and lethargic  MEDICATIONS AT HOME:  Prior to Admission medications   Not on File      PHYSICAL EXAMINATION:   VITAL SIGNS: Blood pressure (!) 163/85, pulse (!) 119, temperature (!) 103 F (39.4 C), temperature source Oral, resp. rate (!) 34, height 5\' 5"  (1.651 m), weight 49 kg  (108 lb), SpO2 (!) 77 %.  GENERAL:  84 y.o.-year-old patient lying in the bed in mild respiratory distress.  EYES: Pupils equal, round, reactive to light and accommodation. No scleral icterus. Extraocular muscles intact.  HEENT: Head atraumatic, normocephalic. Oropharynx and nasopharynx clear.  NECK:  Supple, no jugular venous distention. No thyroid enlargement, no tenderness.  LUNGS: Decreased breath sounds bilaterally, scattered wheezing,bilateral rales heard. No use of accessory muscles of respiration.  CARDIOVASCULAR: S1, S2 tachycardia noted. No murmurs, rubs, or gallops.  ABDOMEN: Soft, nontender, nondistended. Bowel sounds present. No organomegaly or mass.  EXTREMITIES: No pedal edema, cyanosis, or clubbing.  NEUROLOGIC: Cranial nerves II through XII are intact. Muscle strength 5/5 in all extremities. Sensation intact. Gait not checked.  Not completely oriented to time, place and person PSYCHIATRIC: could not beassessed SKIN: No obvious rash, lesion, or ulcer.   LABORATORY PANEL:   CBC Recent Labs  Lab 06/08/17 1954  WBC 4.1  HGB 12.3  HCT 35.1  PLT 151  MCV 115.5*  MCH 40.7*  MCHC 35.2  RDW 15.1*  LYMPHSABS PENDING  MONOABS PENDING  EOSABS PENDING  BASOSABS PENDING   ------------------------------------------------------------------------------------------------------------------  Chemistries  Recent Labs  Lab 06/08/17 1954  NA 134*  K 3.6  CL 96*  CO2 26  GLUCOSE 129*  BUN 28*  CREATININE 0.63  CALCIUM 8.8*  AST 33  ALT 14  ALKPHOS 48  BILITOT 2.1*   ------------------------------------------------------------------------------------------------------------------ estimated  creatinine clearance is 29.6 mL/min (by C-G formula based on SCr of 0.63 mg/dL). ------------------------------------------------------------------------------------------------------------------ No results for input(s): TSH, T4TOTAL, T3FREE, THYROIDAB in the last 72  hours.  Invalid input(s): FREET3   Coagulation profile No results for input(s): INR, PROTIME in the last 168 hours. ------------------------------------------------------------------------------------------------------------------- No results for input(s): DDIMER in the last 72 hours. -------------------------------------------------------------------------------------------------------------------  Cardiac Enzymes Recent Labs  Lab 06/08/17 1954  TROPONINI 0.05*   ------------------------------------------------------------------------------------------------------------------ Invalid input(s): POCBNP  ---------------------------------------------------------------------------------------------------------------  Urinalysis    Component Value Date/Time   COLORURINE AMBER (A) 06/08/2017 2021   APPEARANCEUR CLOUDY (A) 06/08/2017 2021   LABSPEC 1.018 06/08/2017 2021   PHURINE 5.0 06/08/2017 2021   GLUCOSEU NEGATIVE 06/08/2017 2021   HGBUR MODERATE (A) 06/08/2017 2021   BILIRUBINUR NEGATIVE 06/08/2017 2021   KETONESUR 20 (A) 06/08/2017 2021   PROTEINUR 100 (A) 06/08/2017 2021   NITRITE NEGATIVE 06/08/2017 2021   LEUKOCYTESUR SMALL (A) 06/08/2017 2021     RADIOLOGY: Dg Chest Port 1 View  Result Date: 06/08/2017 CLINICAL DATA:  Code sepsis. EXAM: PORTABLE CHEST 1 VIEW COMPARISON:  03/10/2007 FINDINGS: There is consolidation at the left lung base obscuring the left hemidiaphragm and portions of the left heart border. Thickened bronchovascular markings and reticular opacities are noted at the right lung base. These findings are new from the prior exam. There is reticular type scarring in both upper lobes extending to the apices, stable from the prior exam. No evidence of pulmonary edema. Suspect some pleural fluid on the left. Possible minimal right pleural effusion. No pneumothorax. Cardiac silhouette is normal in size. No mediastinal or hilar masses. Skeletal structures are  demineralized but grossly intact. IMPRESSION: 1. Left lung base confluent opacity consistent with pneumonia. 2. Right lung base prominent bronchovascular markings and reticular type opacities. Additional pneumonia is suspected. Electronically Signed   By: Amie Portland M.D.   On: 06/08/2017 20:24    EKG: Orders placed or performed during the hospital encounter of 06/08/17  . EKG 12-Lead  . EKG 12-Lead  . EKG 12-Lead  . EKG 12-Lead    IMPRESSION AND PLAN:  84 year old elderly female patient with no significant past medical history who is a resident of home place at [facility] presented to the emergency room with fever, cough, chills, lethargy.  1.Healthcare associated pneumonia Admit patient to inpatient service  start patient on IV vancomycin and IV cefepime antibiotics Check for flu  2.Sepsis IV fluid hydration  follow-up cultures Broad-spectrum antibiotics  3.Dehydration and hyponatremia IV fluids  4.Elevated troponin secondary to demand ischemia  5.  Acute respiratory distress with hypoxia Oxygen via nasal cannula  6.  Altered mental status secondary to metabolic encephalopathy from sepsis IV fluids and antibiotics  7.DVT prophylaxis with Lovenox subcutaneously      All the records are reviewed and case discussed with ED provider. Management plans discussed with the patient, family and they are in agreement.  CODE STATUS:DNR    Code Status Orders  (From admission, onward)        Start     Ordered   06/08/17 2055  Do not attempt resuscitation/DNR  Continuous    Question Answer Comment  In the event of cardiac or respiratory ARREST Do not call a "code blue"   In the event of cardiac or respiratory ARREST Do not perform Intubation, CPR, defibrillation or ACLS   In the event of cardiac or respiratory ARREST Use medication by any route, position, wound care, and other measures to relive pain and suffering. May use  oxygen, suction and manual treatment of airway  obstruction as needed for comfort.      06/08/17 2054    Code Status History    This patient has a current code status but no historical code status.       TOTAL TIME TAKING CARE OF THIS PATIENT: 50 minutes.    Norma AustinPavan Perez M.D on 06/08/2017 at 9:18 PM  Between 7am to 6pm - Pager - [phone]  After 6pm go to [email or web address] - password [email or web address]  Norma Neighborsagle [place] [facility]  Office  [phone]  CC: Primary care physician; Patient, No Pcp Per

## 2017-06-09 ENCOUNTER — Encounter: Payer: Self-pay | Admitting: Internal Medicine

## 2017-06-09 LAB — BLOOD CULTURE ID PANEL (REFLEXED)
ACINETOBACTER BAUMANNII: NOT DETECTED
CANDIDA ALBICANS: NOT DETECTED
CANDIDA GLABRATA: NOT DETECTED
CANDIDA KRUSEI: NOT DETECTED
Candida parapsilosis: NOT DETECTED
Candida tropicalis: NOT DETECTED
ENTEROBACTER CLOACAE COMPLEX: NOT DETECTED
ENTEROBACTERIACEAE SPECIES: NOT DETECTED
ENTEROCOCCUS SPECIES: NOT DETECTED
Escherichia coli: NOT DETECTED
Haemophilus influenzae: NOT DETECTED
Klebsiella oxytoca: NOT DETECTED
Klebsiella pneumoniae: NOT DETECTED
LISTERIA MONOCYTOGENES: NOT DETECTED
Methicillin resistance: DETECTED — AB
NEISSERIA MENINGITIDIS: NOT DETECTED
PSEUDOMONAS AERUGINOSA: NOT DETECTED
Proteus species: NOT DETECTED
STAPHYLOCOCCUS SPECIES: DETECTED — AB
STREPTOCOCCUS AGALACTIAE: NOT DETECTED
STREPTOCOCCUS PNEUMONIAE: NOT DETECTED
STREPTOCOCCUS PYOGENES: NOT DETECTED
STREPTOCOCCUS SPECIES: DETECTED — AB
Serratia marcescens: NOT DETECTED
Staphylococcus aureus (BCID): NOT DETECTED

## 2017-06-09 LAB — BASIC METABOLIC PANEL
ANION GAP: 10 (ref 5–15)
BUN: 24 mg/dL — ABNORMAL HIGH (ref 6–20)
CHLORIDE: 105 mmol/L (ref 101–111)
CO2: 22 mmol/L (ref 22–32)
Calcium: 7.8 mg/dL — ABNORMAL LOW (ref 8.9–10.3)
Creatinine, Ser: 0.62 mg/dL (ref 0.44–1.00)
GFR calc Af Amer: >60 mL/min (ref >60)
GFR calc non Af Amer: >60 mL/min (ref >60)
GLUCOSE: 135 mg/dL — AB (ref 65–99)
POTASSIUM: 3.4 mmol/L — AB (ref 3.5–5.1)
Sodium: 137 mmol/L (ref 135–145)

## 2017-06-09 LAB — TROPONIN I
Troponin I: 0.06 ng/mL (ref <0.03)
Troponin I: 0.06 ng/mL (ref <0.03)

## 2017-06-09 LAB — LACTIC ACID, PLASMA
LACTIC ACID, VENOUS: 1.5 mmol/L (ref 0.5–1.9)
Lactic Acid, Venous: 2 mmol/L (ref 0.5–1.9)

## 2017-06-09 LAB — CBC
HEMATOCRIT: 34.6 % — AB (ref 35.0–47.0)
HEMOGLOBIN: 12 g/dL (ref 12.0–16.0)
MCH: 40.4 pg — ABNORMAL HIGH (ref 26.0–34.0)
MCHC: 34.8 g/dL (ref 32.0–36.0)
MCV: 116.4 fL — AB (ref 80.0–100.0)
Platelets: 152 10*3/uL (ref 150–440)
RBC: 2.97 MIL/uL — ABNORMAL LOW (ref 3.80–5.20)
RDW: 15.3 % — ABNORMAL HIGH (ref 11.5–14.5)
WBC: 3.4 10*3/uL — AB (ref 3.6–11.0)

## 2017-06-09 LAB — MRSA PCR SCREENING: MRSA BY PCR: NEGATIVE

## 2017-06-09 LAB — PATHOLOGIST SMEAR REVIEW

## 2017-06-09 MED ORDER — AZITHROMYCIN 500 MG PO TABS: 500.0000 mg | ORAL_TABLET | Freq: Every day | ORAL | Status: DC

## 2017-06-09 MED ORDER — ENSURE ENLIVE PO LIQD
237.0000 mL | Freq: Two times a day (BID) | ORAL | Status: DC
  Administered 2017-06-10 – 2017-06-11 (×4): 237 mL via ORAL

## 2017-06-09 MED ORDER — SODIUM CHLORIDE 0.9 % IV SOLN
2.0000 g | INTRAVENOUS | Status: DC
  Filled 2017-06-09: qty 2

## 2017-06-09 MED ORDER — ORAL CARE MOUTH RINSE
15.0000 mL | Freq: Two times a day (BID) | OROMUCOSAL | Status: DC
  Administered 2017-06-09 – 2017-06-11 (×4): 15 mL via OROMUCOSAL

## 2017-06-09 MED ORDER — SODIUM CHLORIDE 0.9 % IV SOLN
2.0000 g | INTRAVENOUS | Status: DC
  Administered 2017-06-09 – 2017-06-10 (×2): 2 g via INTRAVENOUS
  Filled 2017-06-09 (×3): qty 20

## 2017-06-09 NOTE — Discharge Instructions (Signed)
Nutrition Post Hospital Stay °Proper nutrition can help your body recover from illness and injury.   °Foods and beverages high in protein, vitamins, and minerals help rebuild muscle loss, promote healing, & reduce fall risk.  ° °•In addition to eating healthy foods, a nutrition shake is an easy, delicious way to get the nutrition you need during and after your hospital stay ° °It is recommended that you continue to drink 2 bottles per day of:       Ensure Enlive ° °Tips for adding a nutrition shake into your routine: °As allowed, drink one with vitamins or medications instead of water or juice °Enjoy one as a tasty mid-morning or afternoon snack °Drink cold or make a milkshake out of it °Drink one instead of milk with cereal or snacks °Use as a coffee creamer °  °Available at the following grocery stores and pharmacies:           °* [employer] * [employer] * [employer]  °* [employer]          * [employer] * [employer]  °* [facility]reens      * Target  * [employer]'s   °* [employer]S  * [employer]   °* [facility] [phone]  °          °For COUPONS visit: www.ensure.com/join or www.[email or web address]/members/sign-[email or web address]  ° °Suggested Substitutions °Ensure Plus = Boost Plus = Carnation Breakfast Essentials = Boost Compact °Ensure Active Clear = Boost Breeze °Glucerna Shake = Boost Glucose Control = Carnation Breakfast Essentials SUGAR FREE ° °  ° °

## 2017-06-09 NOTE — Plan of Care (Signed)
  Problem: Respiratory: Goal: Ability to maintain adequate ventilation will improve Outcome: Progressing Goal: Ability to maintain a clear airway will improve Outcome: Progressing   Problem: Education: Goal: Knowledge of General Education information will improve Outcome: Progressing   Problem: Clinical Measurements: Goal: Ability to maintain clinical measurements within normal limits will improve Outcome: Progressing Goal: Will remain free from infection Outcome: Progressing Goal: Diagnostic test results will improve Outcome: Progressing Goal: Respiratory complications will improve Outcome: Progressing   Problem: Elimination: Goal: Will not experience complications related to bowel motility Outcome: Progressing Goal: Will not experience complications related to urinary retention Outcome: Progressing   Problem: Safety: Goal: Ability to remain free from injury will improve Outcome: Progressing   Problem: Skin Integrity: Goal: Risk for impaired skin integrity will decrease Outcome: Progressing

## 2017-06-09 NOTE — Progress Notes (Signed)
Pharmacy Antibiotic Note  Norma Perez is a 84 y.o. female admitted on 06/08/2017 with pneumonia.  Pharmacy has been consulted for cefepime dosing. Vancomycin was discontinued as MRSA PCR is negative.  Plan: Will reduce cefepime dose based on renal function to 2 g IV daily  Height: 5\' 5"  (165.1 cm) Weight: 105 lb 9.6 oz (47.9 kg) IBW/kg (Calculated) : 57  Temp (24hrs), Avg:99.5 F (37.5 C), Min:98.2 F (36.8 C), Max:103 F (39.4 C)  Recent Labs  Lab 06/08/17 1954 06/08/17 2235 06/09/17 0100 06/09/17 0605  WBC 4.1  --   --  3.4*  CREATININE 0.63  --   --  0.62  LATICACIDVEN 1.6 2.7* 2.0* 1.5    Estimated Creatinine Clearance: 29 mL/min (by C-G formula based on SCr of 0.62 mg/dL).    No Known Allergies  Antimicrobials this admission: cefepime 3/25 >>  vancomycin 3/25 >> 3/26  Dose adjustments this admission: 3/26 cefepime 2 g IV q12h --> cefepime 2 g IV q24h  Microbiology results: 3/25 BCx: No growth < 12 hours 3/25 UCx: Sent   Thank you for allowing pharmacy to be a part of this patient's care.  Cindi CarbonMary M De Jaworski, PharmD, BCPS Clinical Pharmacist 06/09/2017 8:15 AM

## 2017-06-09 NOTE — Progress Notes (Signed)
Initial Nutrition Assessment  DOCUMENTATION CODES:   Severe malnutrition in context of social or environmental circumstances, Underweight  INTERVENTION:  Diet was liberalized to regular today.  Provide Ensure Enlive po BID, each supplement provides 350 kcal and 20 grams of protein.  NUTRITION DIAGNOSIS:   Severe Malnutrition related to social / environmental circumstances(advanced age, inadequate intake) as evidenced by severe fat depletion, severe muscle depletion.  GOAL:   Patient will meet greater than or equal to 90% of their needs  MONITOR:   PO intake, Supplement acceptance, Labs, Weight trends, Skin, I & O's  REASON FOR ASSESSMENT:   Other (Comment)(Low BMI)    ASSESSMENT:   84 year old female who is hard of hearing with no significant PMHx now admitted from [facility] with PNA, acute encephalopathy.   Met with patient and both of her sons at bedside. Patient is very hard of hearing and unable to provide any history. Her sons report she does not usually eat very much at meals but she is eating even less than usual now. Today for lunch she had a few sips of tea and some gelatin. She does not typically drink any oral nutrition supplements. Discussed benefit of an oral nutrition supplement that can provide some extra calories, protein, fluid, and vitamins/minerals. She is typically on a regular-texture diet. They deny any issues with chewing/swallowing, N/V, abdominal pain, or constipation/diarrhea.  Sons report her current body weight of 105.6 lbs is around her UBW. Per chart she was 109.4 lbs on 10/07/2015. Weight of 125 lbs on 10/20/2016 likely not accurate.  Medications reviewed and include: ceftriaxone.  Labs reviewed: Potassium 3.4, BUN 24.  Discussed with RN and MD. Plan is to liberalize diet.  NUTRITION - FOCUSED PHYSICAL EXAM:    Most Recent Value  Orbital Region  Severe depletion  Upper Arm Region  Severe depletion  Thoracic and Lumbar Region  Severe depletion   Buccal Region  Severe depletion  Temple Region  Severe depletion  Clavicle Bone Region  Severe depletion  Clavicle and Acromion Bone Region  Severe depletion  Scapular Bone Region  Severe depletion  Dorsal Hand  Severe depletion  Patellar Region  Severe depletion  Anterior Thigh Region  Severe depletion  Posterior Calf Region  Severe depletion  Edema (RD Assessment)  None  Hair  Reviewed  Eyes  Reviewed [pt unable to open right eye,  sons report this is normal for her]  Mouth  Reviewed  Skin  Reviewed  Nails  Reviewed     Diet Order:  Diet regular Room service appropriate? Yes; Fluid consistency: Thin  EDUCATION NEEDS:   Not appropriate for education at this time  Skin:  Skin Assessment: Reviewed RN Assessment(ecchymosis to right hip; excoriated location to leg, back, and buttocks)  Last BM:  06/09/2017 - smear  Height:   Ht Readings from Last 1 Encounters:  06/08/17 _0  (1.651 m)    Weight:   Wt Readings from Last 1 Encounters:  06/08/17 105 lb 9.6 oz (47.9 kg)    Ideal Body Weight:  56.8 kg  BMI:  Body mass index is 17.57 kg/m.  Estimated Nutritional Needs:   Kcal:  1200-1400 (25-30 kcal/kg)  Protein:  60-70 grams (1.25-1.5 grams/kg)  Fluid:  1.2-1.4 L/day (1 mL/kcal)  Willey Blade, MS, RD, LDN Office: [phone] Pager: [phone] After Hours/Weekend Pager: [phone]

## 2017-06-09 NOTE — Progress Notes (Signed)
Verbal order from Dr. Sudini to discontinue telemetry monitor.  Danielle Schelly Chuba, RN 

## 2017-06-09 NOTE — Progress Notes (Signed)
CRITICAL VALUE STICKER  CRITICAL VALUE: Lactic Acid 2.0  RECEIVER (on-site recipient of call):Shunta Mclaurin RN  DATE & TIME NOTIFIED: 0206  06/09/17  MESSENGER (representative from lab):  MD NOTIFIED:   TIME OF NOTIFICATION:  RESPONSE:

## 2017-06-09 NOTE — Progress Notes (Signed)
[facility] - [place] at [facility]   PATIENT NAME: Norma RossettiMildred Oshana    MR#:  [id number]  DATE OF BIRTH:  06-25-1916  SUBJECTIVE:  CHIEF COMPLAINT:   Chief Complaint  Patient presents with  . Fever   Patient has been mildly confused.  Has decreased hearing and also poor vision.  Daughter-in-law at bedside.  REVIEW OF SYSTEMS:    Review of Systems  Unable to perform ROS: Mental status change    DRUG ALLERGIES:  No Known Allergies  VITALS:  Blood pressure (!) 145/61, pulse 81, temperature 98.2 F (36.8 C), temperature source Oral, resp. rate 18, height 5\' 5"  (1.651 m), weight 47.9 kg (105 lb 9.6 oz), SpO2 97 %.  PHYSICAL EXAMINATION:   Physical Exam  GENERAL:  84 y.o.-year-old patient lying in the bed with no acute distress.  Decreased hearing EYES:  No scleral icterus. Extraocular muscles intact.  HEENT: Head atraumatic, normocephalic. Oropharynx and nasopharynx clear.  NECK:  Supple, no jugular venous distention. No thyroid enlargement, no tenderness.  LUNGS: Decreased air entry bilaterally CARDIOVASCULAR: S1, S2 normal. No murmurs, rubs, or gallops.  ABDOMEN: Soft, nontender, nondistended. Bowel sounds present. No organomegaly or mass.  EXTREMITIES: No cyanosis, clubbing or edema b/l.    NEUROLOGIC: Cranial nerves II through XII are intact. No focal Motor or sensory deficits b/l.   PSYCHIATRIC: The patient is awake and alert   LABORATORY PANEL:   CBC Recent Labs  Lab 06/09/17 0605  WBC 3.4*  HGB 12.0  HCT 34.6*  PLT 152   ------------------------------------------------------------------------------------------------------------------ Chemistries  Recent Labs  Lab 06/08/17 1954 06/09/17 0605  NA 134* 137  K 3.6 3.4*  CL 96* 105  CO2 26 22  GLUCOSE 129* 135*  BUN 28* 24*  CREATININE 0.63 0.62  CALCIUM 8.8* 7.8*  AST 33  --   ALT 14  --   ALKPHOS 48  --   BILITOT 2.1*  --     ------------------------------------------------------------------------------------------------------------------  Cardiac Enzymes Recent Labs  Lab 06/09/17 0605  TROPONINI 0.06*   ------------------------------------------------------------------------------------------------------------------  RADIOLOGY:  Dg Chest Port 1 View  Result Date: 06/08/2017 CLINICAL DATA:  Code sepsis. EXAM: PORTABLE CHEST 1 VIEW COMPARISON:  03/10/2007 FINDINGS: There is consolidation at the left lung base obscuring the left hemidiaphragm and portions of the left heart border. Thickened bronchovascular markings and reticular opacities are noted at the right lung base. These findings are new from the prior exam. There is reticular type scarring in both upper lobes extending to the apices, stable from the prior exam. No evidence of pulmonary edema. Suspect some pleural fluid on the left. Possible minimal right pleural effusion. No pneumothorax. Cardiac silhouette is normal in size. No mediastinal or hilar masses. Skeletal structures are demineralized but grossly intact. IMPRESSION: 1. Left lung base confluent opacity consistent with pneumonia. 2. Right lung base prominent bronchovascular markings and reticular type opacities. Additional pneumonia is suspected. Electronically Signed   By: Amie Portlandavid  Ormond M.D.   On: 06/08/2017 20:24     ASSESSMENT AND PLAN:   84 year old elderly female patient with no significant past medical history who is a resident of [facility] at [place] presented to the emergency room with fever, cough, chills, lethargy.  *Bibasilar pneumonia with acute hypoxic respiratory failure Will discontinue IV vancomycin.  Continue IV cefepime.  Add azithromycin. Nebulizers as needed. Wean oxygen as tolerated.  *Sepsis present on admission.  IV fluid resuscitation initiated on admission.  Improved.  *Mild hyponatremia, hypovolemic.  Resolved.  *Mild elevation in  troponin due to demand  ischemia.  Not MI.  *Acute toxic and metabolic encephalopathy.  Improving.  Could have inpatient delirium.  *DVT prophylaxis with Lovenox  All the records are reviewed and case discussed with Care Management/Social Worker Management plans discussed with the patient, family and they are in agreement.  CODE STATUS: DNR  DVT Prophylaxis: SCDs  TOTAL TIME TAKING CARE OF THIS PATIENT: 30  minutes.   POSSIBLE D/C IN 1-2 DAYS, DEPENDING ON CLINICAL CONDITION.  Molinda Bailiff Xayden Linsey M.D on 06/09/2017 at 11:03 AM  Between 7am to 6pm - Pager - [phone]  After 6pm go to [email or web address] - password [email or web address]  [facility] [place] [facility]  Office  [phone]  CC: Primary care physician; Patient, No Pcp Per  Note: This dictation was prepared with Dragon dictation along with smaller phrase technology. Any transcriptional errors that result from this process are unintentional.

## 2017-06-09 NOTE — Progress Notes (Signed)
PHARMACY - PHYSICIAN COMMUNICATION CRITICAL VALUE ALERT - BLOOD CULTURE IDENTIFICATION (BCID)  Norma Perez is an 84 y.o. female who presented to [facility] on 06/08/2017 with a chief complaint of PNA  Name of physician (or Provider) Contacted: Dr. Elpidio AnisSudini  Current antibiotics: cefepime  Changes to prescribed antibiotics recommended: Antibiotics to be changed to ceftriaxone 2 g IV daily per discussion with MD  Results for orders placed or performed during the hospital encounter of 06/08/17  Blood Culture ID Panel (Reflexed) (Collected: 06/08/2017  7:54 PM)  Result Value Ref Range   Enterococcus species NOT DETECTED NOT DETECTED   Listeria monocytogenes NOT DETECTED NOT DETECTED   Staphylococcus species DETECTED (A) NOT DETECTED   Staphylococcus aureus NOT DETECTED NOT DETECTED   Methicillin resistance DETECTED (A) NOT DETECTED   Streptococcus species DETECTED (A) NOT DETECTED   Streptococcus agalactiae NOT DETECTED NOT DETECTED   Streptococcus pneumoniae NOT DETECTED NOT DETECTED   Streptococcus pyogenes NOT DETECTED NOT DETECTED   Acinetobacter baumannii NOT DETECTED NOT DETECTED   Enterobacteriaceae species NOT DETECTED NOT DETECTED   Enterobacter cloacae complex NOT DETECTED NOT DETECTED   Escherichia coli NOT DETECTED NOT DETECTED   Klebsiella oxytoca NOT DETECTED NOT DETECTED   Klebsiella pneumoniae NOT DETECTED NOT DETECTED   Proteus species NOT DETECTED NOT DETECTED   Serratia marcescens NOT DETECTED NOT DETECTED   Haemophilus influenzae NOT DETECTED NOT DETECTED   Neisseria meningitidis NOT DETECTED NOT DETECTED   Pseudomonas aeruginosa NOT DETECTED NOT DETECTED   Candida albicans NOT DETECTED NOT DETECTED   Candida glabrata NOT DETECTED NOT DETECTED   Candida krusei NOT DETECTED NOT DETECTED   Candida parapsilosis NOT DETECTED NOT DETECTED   Candida tropicalis NOT DETECTED NOT DETECTED    Cindi CarbonMary M Lotta [place]en[place], PharmD, BCPS Clinical Pharmacist 06/09/2017  2:32 PM

## 2017-06-10 DIAGNOSIS — E43 Unspecified severe protein-calorie malnutrition: Secondary | ICD-10-CM

## 2017-06-10 LAB — URINE CULTURE

## 2017-06-10 NOTE — Evaluation (Signed)
Physical Therapy Evaluation Patient Details Name: Norma Perez MRN: [id number] DOB: [birth year] Today's Date: 06/10/2017   History of Present Illness  Pt is a 84 y/o F presenting to hospital from [facility] [place], w/ fever, cough, chills, and lethargy 06/08/17. Pt admitted 06/08/17 w/ diagnosis of sepsis, pneumonia, streptococcus, acute respiratory failure, mild hyponatremia, hypovolemic, elevated troponin due to demand ischemia, encephalopathy, and AMS. 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    Clinical Impression  Pt demonstrated bed mobility, transfers, and ambulation (5 ft) w/ RW min assist (see mobility and ambulation for details); Pt limited by fatigue. 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 Pt oriented to self and daughter in law, who was present; Pt is pleasant and agreeable to session. Pt demonstrated mild SOB; HR and O2 sats monitored throughout session and remained WNL. Overall Pt tolerated session fairly well, limited by fatigue. Pt would benefit from skilled PT to progress bed mobility, transfers, ambulation, and activity tolerance. Recommend HHPT w/ 24/7 assist upon discharge from acute hospitalization.   Follow Up Recommendations Home health PT;Supervision/Assistance - 24 hour    Equipment Recommendations  Rolling walker with 5" wheels;3in1 (PT)    Recommendations for Other Services       Precautions / Restrictions Precautions Precautions: Fall Restrictions Weight Bearing Restrictions: No      Mobility  Bed Mobility Overal bed mobility: Needs Assistance Bed Mobility: Supine to Sit     Supine to sit: Min assist     General bed mobility comments: assist provided to lower LLE from bed to floor, to bring trunk forward, and therapist provided a hand for Pt to pull in order to bring trunk forward; vc's to square hips to edge of bed.  Transfers Overall transfer level: Needs  assistance Equipment used: Rolling walker (2 wheeled) Transfers: Sit to/from Stand Sit to Stand: Min assist         General transfer comment: assist to lift; vc's to "stand up", Pt required extra time an effort demonstrating mild SOB, HR and O2 monitored throughout session and remained stable WNL.  Ambulation/Gait Ambulation/Gait assistance: Min assist Ambulation Distance (Feet): 5 Feet Assistive device: Rolling walker (2 wheeled) Gait Pattern/deviations: Step-through pattern;Decreased step length - right;Decreased step length - left Gait velocity: decreased   General Gait Details: assist provided for stability during gait, Pt demonstrates mild posterior lean; vc's to "move to the chair", to keep walker close when stepping backward for safety  Stairs            Wheelchair Mobility    Modified Rankin (Stroke Patients Only)       Balance Overall balance assessment: Needs assistance Sitting-balance support: Bilateral upper extremity supported Sitting balance-Leahy Scale: Poor Sitting balance - Comments: required holding onto rail with one hand and the bed with the other hand to maintain stability in sitting.     Standing balance-Leahy Scale: Poor Standing balance comment: required RW and min assist for stability prior to ambulation                             Pertinent Vitals/Pain Pain Assessment: No/denies pain Pain Score: 0-No pain Pain Intervention(s): Limited activity within patient's tolerance;Monitored during session;Repositioned    Home Living Family/patient expects to be discharged to:: Assisted living Living Arrangements: Other (Comment)([facility] in [place]) Available Help at Discharge:  Family Type of Home: Assisted living Home Access: Level entry;Elevator     Home Layout: Two level Home Equipment: Walker - 4 wheels;Toilet riser;Grab bars - tub/shower;Grab bars - toilet      Prior Function Level of Independence: Needs assistance    Gait / Transfers Assistance Needed: Daughter in law reports Pt ambulated w/ RW  ADL's / Homemaking Assistance Needed: daughter in law reports she has had assistance with some ADLs such as bathing and cutting food at meals        Hand Dominance   Dominant Hand: Right    Extremity/Trunk Assessment   Upper Extremity Assessment Upper Extremity Assessment: Overall WFL for tasks assessed;RUE deficits/detail;LUE deficits/detail RUE Deficits / Details: BUE AROM WFL and strength 4/5 w/ squeezing therapists fingers. LUE Deficits / Details: BUE AROM WFL and strength 4/5 w/ squeezing therapists fingers.    Lower Extremity Assessment Lower Extremity Assessment: Generalized weakness;RLE deficits/detail;LLE deficits/detail RLE Deficits / Details: BLE AROM WFL and strength at least 3/5 when bringing knees to chest. LLE Deficits / Details: BLE AROM WFL and strength at least 3/5 when bringing knees to chest.    Cervical / Trunk Assessment Cervical / Trunk Assessment: Kyphotic  Communication   Communication: HOH  Cognition Arousal/Alertness: Awake/alert Behavior During Therapy: WFL for tasks assessed/performed Overall Cognitive Status: Within Functional Limits for tasks assessed                                 General Comments: Pt A&O x 2 (oriented to self and daughter in law, who was present, but thought [facility])      General Comments  Pt agreeable to session    Exercises     Assessment/Plan    PT Assessment Patient needs continued PT services  PT Problem List Decreased strength;Decreased range of motion;Decreased activity tolerance;Decreased balance;Decreased mobility;Decreased coordination;Decreased knowledge of precautions;Decreased knowledge of use of DME;Decreased safety awareness       PT Treatment Interventions DME instruction;Gait training;Functional mobility training;Therapeutic activities;Therapeutic exercise;Balance  training;Patient/family education    PT Goals (Current goals can be found in the Care Plan section)  Acute Rehab PT Goals Patient Stated Goal: I want to get stronger. PT Goal Formulation: With patient Time For Goal Achievement: 06/24/17 Potential to Achieve Goals: Fair    Frequency Min 2X/week   Barriers to discharge        Co-evaluation               AM-PAC PT "6 Clicks" Daily Activity  Outcome Measure Difficulty turning over in bed (including adjusting bedclothes, sheets and blankets)?: A Little Difficulty moving from lying on back to sitting on the side of the bed? : A Little Difficulty sitting down on and standing up from a chair with arms (e.g., wheelchair, bedside commode, etc,.)?: Unable Help needed moving to and from a bed to chair (including a wheelchair)?: A Little Help needed walking in hospital room?: A Lot Help needed climbing 3-5 steps with a railing? : A Lot 6 Click Score: 14    End of Session Equipment Utilized During Treatment: Gait belt Activity Tolerance: Patient limited by fatigue Patient left: in chair;with call bell/phone within reach;with chair alarm set;with family/visitor present;with SCD's reapplied(daughter in law present) Nurse Communication: Mobility status(HR and O2 stable WNL) PT Visit Diagnosis: Unsteadiness on feet (R26.81);Other abnormalities of gait and mobility (R26.89);Muscle weakness (generalized) (M62.81);Difficulty in walking, not elsewhere classified (R26.2)  Time: 1610-9604 PT Time Calculation (min) (ACUTE ONLY): 35 min   Charges:         PT G Codes:         Claudy Abdallah Mondrian-Pardue, SPT 06/10/2017, 4:10 PM

## 2017-06-10 NOTE — Progress Notes (Signed)
Pharmacy Antibiotic Note  Norma SanteeMildred L Perez is a 84 y.o. female admitted on 06/08/2017 with PNA and streptococcus bacteremia. Pharmacy has been consulted for ceftriaxone dosing.  Plan:  Continue ceftriaxone 2 g IV daily  Height: 5\' 5"  (165.1 cm) Weight: 105 lb 9.6 oz (47.9 kg) IBW/kg (Calculated) : 57  Temp (24hrs), Avg:98.3 F (36.8 C), Min:97.4 F (36.3 C), Max:99 F (37.2 C)  Recent Labs  Lab 06/08/17 1954 06/08/17 2235 06/09/17 0100 06/09/17 0605  WBC 4.1  --   --  3.4*  CREATININE 0.63  --   --  0.62  LATICACIDVEN 1.6 2.7* 2.0* 1.5    Estimated Creatinine Clearance: 29 mL/min (by C-G formula based on SCr of 0.62 mg/dL).    No Known Allergies  Antimicrobials this admission: cefepime 3/25 >> 3/26 Vancomycin 3/25 >> 3/26 Ceftriaxone 3/26 >>  Dose adjustments this admission:  Microbiology results: 3/25 BCx: 2/4 GPC, streptococcus species (treating with CTX), staph species mec A detected believed to be contaminant per MD 3/25 UCx: Sent 3/2 MRSA PCR: Negative  Thank you for allowing pharmacy to be a part of this patient's care.  Cindi CarbonMary M Ladislav Caselli, PharmD, BCPS Clinical Pharmacist 06/10/2017 7:30 AM

## 2017-06-10 NOTE — Progress Notes (Signed)
[facility] -  at [facility]   PATIENT NAME: Norma Perez    MR#:  [id number]  DATE OF BIRTH:  25-Jul-1916  SUBJECTIVE:  CHIEF COMPLAINT:   Chief Complaint  Patient presents with  . Fever   Patient has decreased hearing and poor vision.  Some confusion.  Daughter-in-law at bedside.  On 1 L oxygen per  REVIEW OF SYSTEMS:    Review of Systems  Unable to perform ROS: Mental status change    DRUG ALLERGIES:  No Known Allergies  VITALS:  Blood pressure (!) 149/72, pulse 86, temperature (!) 97.4 F (36.3 C), temperature source Axillary, resp. rate 18, height 5\' 5"  (1.651 m), weight 47.9 kg (105 lb 9.6 oz), SpO2 97 %.  PHYSICAL EXAMINATION:   Physical Exam  GENERAL:  84 y.o.-year-old patient lying in the bed with no acute distress.  Decreased hearing EYES:  No scleral icterus. Extraocular muscles intact.  HEENT: Head atraumatic, normocephalic. Oropharynx and nasopharynx clear.  NECK:  Supple, no jugular venous distention. No thyroid enlargement, no tenderness.  LUNGS: Decreased air entry bilaterally CARDIOVASCULAR: S1, S2 normal. No murmurs, rubs, or gallops.  ABDOMEN: Soft, nontender, nondistended. Bowel sounds present. No organomegaly or mass.  EXTREMITIES: No cyanosis, clubbing or edema b/l.    NEUROLOGIC: Cranial nerves II through XII are intact. No focal Motor or sensory deficits b/l.   PSYCHIATRIC: The patient is awake and alert   LABORATORY PANEL:   CBC Recent Labs  Lab 06/09/17 0605  WBC 3.4*  HGB 12.0  HCT 34.6*  PLT 152   ------------------------------------------------------------------------------------------------------------------ Chemistries  Recent Labs  Lab 06/08/17 1954 06/09/17 0605  NA 134* 137  K 3.6 3.4*  CL 96* 105  CO2 26 22  GLUCOSE 129* 135*  BUN 28* 24*  CREATININE 0.63 0.62  CALCIUM 8.8* 7.8*  AST 33  --   ALT 14  --   ALKPHOS 48  --   BILITOT 2.1*  --     ------------------------------------------------------------------------------------------------------------------  Cardiac Enzymes Recent Labs  Lab 06/09/17 0605  TROPONINI 0.06*   ------------------------------------------------------------------------------------------------------------------  RADIOLOGY:  Dg Chest Port 1 View  Result Date: 06/08/2017 CLINICAL DATA:  Code sepsis. EXAM: PORTABLE CHEST 1 VIEW COMPARISON:  03/10/2007 FINDINGS: There is consolidation at the left lung base obscuring the left hemidiaphragm and portions of the left heart border. Thickened bronchovascular markings and reticular opacities are noted at the right lung base. These findings are new from the prior exam. There is reticular type scarring in both upper lobes extending to the apices, stable from the prior exam. No evidence of pulmonary edema. Suspect some pleural fluid on the left. Possible minimal right pleural effusion. No pneumothorax. Cardiac silhouette is normal in size. No mediastinal or hilar masses. Skeletal structures are demineralized but grossly intact. IMPRESSION: 1. Left lung base confluent opacity consistent with pneumonia. 2. Right lung base prominent bronchovascular markings and reticular type opacities. Additional pneumonia is suspected. Electronically Signed   By: Amie Portland M.D.   On: 06/08/2017 20:24     ASSESSMENT AND PLAN:   84 year old elderly female patient with no significant past medical history who is a resident of [facility] at [place] presented to the emergency room with fever, cough, chills, lethargy.  *Streptococcus bacteremia.  Likely from pneumonia.  Wait for final sensitivities.  On ceftriaxone. Staff for local arcus in the blood is likely contaminant.  *Bibasilar pneumonia with acute hypoxic respiratory failure On IV ceftriaxone.  Nebulizers as needed.  Wean oxygen as tolerated.  *  Sepsis present on admission.  IV fluid resuscitation initiated on admission.   Improved.  *Mild hyponatremia, hypovolemic.  Resolved.  *Mild elevation in troponin due to demand ischemia.  Not MI.  *Acute toxic and metabolic encephalopathy.  Improving.  Monitor for inpatient delirium.  *DVT prophylaxis with Lovenox  All the records are reviewed and case discussed with Care Management/Social Worker Management plans discussed with the patient, family and they are in agreement.  CODE STATUS: DNR  DVT Prophylaxis: SCDs  TOTAL TIME TAKING CARE OF THIS PATIENT: 30  minutes.   POSSIBLE D/C IN 1-2 DAYS, DEPENDING ON CLINICAL CONDITION.  Molinda BailiffSrikar R Kyonna Frier M.D on 06/10/2017 at 9:46 AM  Between 7am to 6pm - Pager - [phone]  After 6pm go to [email or web address] - password [email or web address]  [facility] [place] [facility]  Office  [phone]  CC: Primary care physician; Patient, No Pcp Per  Note: This dictation was prepared with Dragon dictation along with smaller phrase technology. Any transcriptional errors that result from this process are unintentional.

## 2017-06-11 ENCOUNTER — Encounter: Payer: Self-pay | Admitting: Internal Medicine

## 2017-06-11 LAB — BASIC METABOLIC PANEL
Anion gap: 11 (ref 5–15)
BUN: 29 mg/dL — ABNORMAL HIGH (ref 6–20)
CO2: 23 mmol/L (ref 22–32)
CREATININE: 0.53 mg/dL (ref 0.44–1.00)
Calcium: 8.2 mg/dL — ABNORMAL LOW (ref 8.9–10.3)
Chloride: 105 mmol/L (ref 101–111)
GFR calc non Af Amer: >60 mL/min (ref >60)
Glucose, Bld: 136 mg/dL — ABNORMAL HIGH (ref 65–99)
Potassium: 2.8 mmol/L — ABNORMAL LOW (ref 3.5–5.1)
Sodium: 139 mmol/L (ref 135–145)

## 2017-06-11 LAB — CULTURE, BLOOD (ROUTINE X 2): Special Requests: ADEQUATE

## 2017-06-11 MED ORDER — CEFUROXIME AXETIL 500 MG PO TABS: 500.0000 mg | ORAL_TABLET | Freq: Two times a day (BID) | ORAL | 0 refills | Status: AC

## 2017-06-11 MED ORDER — POTASSIUM CHLORIDE CRYS ER 20 MEQ PO TBCR
40.0000 meq | EXTENDED_RELEASE_TABLET | ORAL | Status: AC
  Administered 2017-06-11 (×2): 40 meq via ORAL
  Filled 2017-06-11 (×2): qty 2

## 2017-06-11 NOTE — Care Management Note (Signed)
Case Management Note  Patient Details  Name: Norma SanteeMildred L Louth MRN: [id number] Date of Birth: 1916-10-13  Subjective/Objective:  Per Clydie BraunKaren with Hospice, patient is not hospice appropriate. Will get palliative care. Spoke with McGrathJondeen again. She is agreeable to home health and has no agency preference. [facility] to [facility] for [facility] and [facility].                   Action/Plan: [facility] for [facility] and [facility]. Pall[facility] at [facility]  Expected Discharge Date:  06/11/17               Expected Discharge Plan:  Home w Home Health Services  In-House Referral:     Discharge planning Services  CM Consult  Post Acute Care Choice:  Home Health Choice offered to:  Adult Children  DME Arranged:    DME Agency:     HH Arranged:  PT, Nurse's Aide HH Agency:  [facility]  Status of Service:  Completed, signed off  If discussed at Long Length of Stay Meetings, dates discussed:    Additional Comments:  Marily MemosLisa M Yuvin Bussiere, RN 06/11/2017, 2:29 PM

## 2017-06-11 NOTE — Care Management Note (Addendum)
Case Management Note  Patient Details  Name: Norma Perez MRN: [id number] Date of Birth: [birth year]  Subjective/Objective:   Consult received for hospice services. Spoke with Clydell HakimJondeen Deacon. She states the family has spoken with the doctor and they want the patient to [facility]. Offered a list of hospices. They choose [facility]. [facility] No DME needed                  Action/Plan:   Expected Discharge Date:  06/11/17               Expected Discharge Plan:  Home w Hospice Care  In-House Referral:     Discharge planning Services  CM Consult  Post Acute Care Choice:  Hospice Choice offered to:  Adult Children  DME Arranged:    DME Agency:     HH Arranged:    HH Agency:  [facility]  Status of Service:  Completed, signed off  If discussed at Long Length of Stay Meetings, dates discussed:    Additional Comments:  Marily MemosLisa M Lautaro Koral, RN 06/11/2017, 1:52 PM

## 2017-06-11 NOTE — Clinical Social Work Note (Signed)
Clinical Social Work Assessment  Patient Details  Name: Norma SanteeMildred L Eller MRN: [id number] Date of Birth: Nov 27, 1916  Date of referral:  06/10/17               Reason for consult:  Facility Placement                Permission sought to share information with:  Facility Medical sales representativeContact Representative Permission granted to share information::  Yes, Verbal Permission Granted  Name::     Gevena Marterry,James Son   [phone] and Cammie Sickleerry,Mike Son [phone] and Clydell Hakimerry,Jondeen Relative [phone]   Agency::  ALF admissions  Relationship::     Contact Information:     Housing/Transportation Living arrangements for the past 2 months:  Assisted Living Facility Source of Information:  Patient, Adult Children Patient Interpreter Needed:  None Criminal Activity/Legal Involvement Pertinent to Current Situation/Hospitalization:  No - Comment as needed Significant Relationships:  Adult Children Lives with:  Facility Resident Do you feel safe going back to the place where you live?  Yes Need for family participation in patient care:  Yes (Comment)  Care giving concerns:  Patient's family did not have any concerns about returning back to [facility]   Social Worker assessment / plan:  Patient is a 84 year old female who is alert and oriented x4 but hard of hearing, and legally blind.  Patient's family was at bedside, CSW explained role of social worker and process of coordinating with ALF to have patient return back to ALF.  Patient's family expressed they are pleased with the care that she is getting and are hopeful patient can return back with home health depending on how she progresses.  Patient's family did not express any other questions or concerns about going to ALF.    Employment status:  Retired Database administratornsurance information:  Managed Medicare PT Recommendations:  Home with Home Health Information / Referral to community resources:     Patient/Family's Response to care:  Patient's family are in agreement to  returning back to ALF.  Patient/Family's Understanding of and Emotional Response to Diagnosis, Current Treatment, and Prognosis:  Patient's family are aware of current treatment plan and diagnosis.  Emotional Assessment Appearance:  Appears stated age Attitude/Demeanor/Rapport:    Affect (typically observed):  Appropriate, Calm Orientation:  Oriented to Self, Oriented to Place, Oriented to  Time, Oriented to Situation Alcohol / Substance use:    Psych involvement (Current and /or in the community):     Discharge Needs  Concerns to be addressed:  Care Coordination Readmission within the last 30 days:  No Current discharge risk:    Barriers to Discharge:  Continued Medical Work up   [place] [employer], Leighana Neyman R, LCSWA 06/11/2017, 10:44 AM

## 2017-06-11 NOTE — Care Management Important Message (Signed)
Important Message  Patient Details  Name: Norma SanteeMildred L Cabler MRN: [id number] Date of Birth: Apr 19, 1916   Medicare Important Message Given:  Yes Pt. Signed the IM and will send to HIM to scan to patient record.   Olegario MessierKathy A Telitha Plath 06/11/2017, 1:52 PM

## 2017-06-11 NOTE — Progress Notes (Signed)
New referral for out patient palliative [facility]. Plan is for discharge today with home health services through [facility]. Patient information faxed to referral. Dayna BarkerKaren  Robertson RN, BSN, [facility], hospital Liaison [phone]

## 2017-06-11 NOTE — Discharge Summary (Addendum)
[facility] [place] [facility]   PATIENT NAME: Norma Perez    MR#:  [id number]  DATE OF BIRTH:  January 29, 1917  DATE OF ADMISSION:  06/08/2017 ADMITTING PHYSICIAN: Ihor Austin, MD  DATE OF DISCHARGE: 06/11/2017  PRIMARY CARE PHYSICIAN: Patient, No Pcp Per   ADMISSION DIAGNOSIS:  DNR (do not resuscitate) [Z66] Sepsis, due to unspecified organism (HCC) [A41.9] Community acquired pneumonia, unspecified laterality [J18.9]  DISCHARGE DIAGNOSIS:  Active Problems:   Pneumonia   Protein-calorie malnutrition, severe   SECONDARY DIAGNOSIS:   Past Medical History:  Diagnosis Date  . Hard of hearing      ADMITTING HISTORY  HISTORY OF PRESENT ILLNESS: [name]  is a 84 y.o. female with significant past medical history he is a resident of [facility] [place] was brought by EMS for fever of 102.5 degrees Fahrenheit.  Patient appears lethargic and weak she has cough productive of phlegm.. She also has fever and chills.  Her roommate had flu and pneumonia.  Flu testing is pending in the emergency room.  Patient was given IV fluid bolus and started on IV antibiotics.  She was worked up with chest x-ray which showed bilateral pneumonia.  Patient is ambulatory with the help of walker at the [facility] home.  She is dependent on instrumental activities of daily living.  Patient's family do not want any cardiac resuscitation and intubation if the need arises.  HOSPITAL COURSE:   84 year old elderly female patient with no significant past medical history who is a resident of [facility] presented to the emergency room with fever, cough, chills, lethargy.  *Streptococcus would not bacteremia.  Likely from pneumonia.   Will discharge patient on Ceftin.  Oral 1 week Discussed with Dr. Orvan Falconer at [facility] with infectious disease  *Bibasilar pneumonia with acute hypoxic respiratory failure Treated with IV ceftriaxone in the hospital.  Initially on  broad-spectrum antibiotics which were stopped.  Will discharge patient on Ceftin.  *Sepsis present on admission.  IV fluid resuscitation initiated on admission.   Resolved  *Mild hyponatremia, hypovolemic.  Resolved.  *Mild elevation in troponin due to demand ischemia.  Not MI.  *Acute toxic and metabolic encephalopathy.   Improved.  *Dementia.  Stable.  Patient will be discharged back to assisted living facility with home health services.  Palliative care to follow.  Will benefit from hospice if any worsening in the future.  CONSULTS OBTAINED:    DRUG ALLERGIES:  No Known Allergies  DISCHARGE MEDICATIONS:   Allergies as of 06/11/2017   No Known Allergies     Medication List    TAKE these medications   cefUROXime 500 MG tablet Commonly known as:  CEFTIN Take 1 tablet (500 mg total) by mouth 2 (two) times daily for 7 days.       Today   VITAL SIGNS:  Blood pressure (!) 143/79, pulse 86, temperature 98.8 F (37.1 C), temperature source Oral, resp. rate 18, height 5\' 5"  (1.651 m), weight 47.9 kg (105 lb 9.6 oz), SpO2 94 %.  I/O:    Intake/Output Summary (Last 24 hours) at 06/11/2017 1308 Last data filed at 06/11/2017 1038 Gross per 24 hour  Intake 340 ml  Output -  Net 340 ml    PHYSICAL EXAMINATION:  Physical Exam  GENERAL:  84 y.o.-year-old patient lying in the bed with no acute distress.  Decreased hearing LUNGS: Normal breath sounds bilaterally CARDIOVASCULAR: S1, S2 normal. No murmurs, rubs, or gallops.  ABDOMEN: Soft, non-tender,  non-distended. Bowel sounds present. No organomegaly or mass.  NEUROLOGIC: Moves all 4 extremities. PSYCHIATRIC: The patient is alert and awake.  Pleasantly confused  DATA REVIEW:   CBC Recent Labs  Lab 06/09/17 0605  WBC 3.4*  HGB 12.0  HCT 34.6*  PLT 152    Chemistries  Recent Labs  Lab 06/08/17 1954  06/11/17 0717  NA 134*   < > 139  K 3.6   < > 2.8*  CL 96*   < > 105  CO2 26   < > 23  GLUCOSE  129*   < > 136*  BUN 28*   < > 29*  CREATININE 0.63   < > 0.53  CALCIUM 8.8*   < > 8.2*  AST 33  --   --   ALT 14  --   --   ALKPHOS 48  --   --   BILITOT 2.1*  --   --    < > = values in this interval not displayed.    Cardiac Enzymes Recent Labs  Lab 06/09/17 0605  TROPONINI 0.06*    Microbiology Results  Results for orders placed or performed during the hospital encounter of 06/08/17  Culture, blood (Routine x 2)     Status: None (Preliminary result)   Collection Time: 06/08/17  7:54 PM  Result Value Ref Range Status   Specimen Description BLOOD LAC  Final   Special Requests   Final    BOTTLES DRAWN AEROBIC AND ANAEROBIC Blood Culture results may not be optimal due to an excessive volume of blood received in culture bottles   Culture   Final    NO GROWTH 3 DAYS Performed at [facility], [address] [place], [place] [address]    Report Status PENDING  Incomplete  Culture, blood (Routine x 2)     Status: Abnormal (Preliminary result)   Collection Time: 06/08/17  7:54 PM  Result Value Ref Range Status   Specimen Description   Final    BLOOD LFOA Performed at [facility], [address], [place], [place] [address]    Special Requests   Final    BOTTLES DRAWN AEROBIC AND ANAEROBIC Blood Culture adequate volume Performed at [facility], [address] [place], [place] [address]    Culture  Setup Time   Final    GRAM POSITIVE COCCI IN BOTH AEROBIC AND ANAEROBIC BOTTLES CRITICAL RESULT CALLED TO, READ BACK BY AND VERIFIED WITH: KAREN HAYES AT 1358 06/09/17 SDR    Culture (A)  Final    VIRIDANS STREPTOCOCCUS THE SIGNIFICANCE OF ISOLATING THIS ORGANISM FROM A SINGLE SET OF BLOOD CULTURES WHEN MULTIPLE SETS ARE DRAWN IS UNCERTAIN. PLEASE NOTIFY THE MICROBIOLOGY DEPARTMENT WITHIN ONE WEEK IF SPECIATION AND SENSITIVITIES ARE REQUIRED. Performed at [facility], [address] [place], [place] [address]    Report Status PENDING   Incomplete  Blood Culture ID Panel (Reflexed)     Status: Abnormal   Collection Time: 06/08/17  7:54 PM  Result Value Ref Range Status   Enterococcus species NOT DETECTED NOT DETECTED Final   Listeria monocytogenes NOT DETECTED NOT DETECTED Final   Staphylococcus species DETECTED (A) NOT DETECTED Final    Comment: Methicillin (oxacillin) resistant coagulase negative staphylococcus. Possible blood culture contaminant (unless isolated from more than one blood culture draw or clinical case suggests pathogenicity). No antibiotic treatment is indicated for blood  culture contaminants. CRITICAL RESULT CALLED TO, READ BACK BY AND VERIFIED WITH:  KAREN HAYES AT 1358 06/09/17 SDR    Staphylococcus aureus NOT DETECTED NOT DETECTED Final   Methicillin resistance DETECTED (A) NOT DETECTED Final    Comment: CRITICAL RESULT CALLED TO, READ BACK BY AND VERIFIED WITH:  KAREN hAYES AT 1358 06/09/17 SDR    Streptococcus species DETECTED (A) NOT DETECTED Final    Comment: Not Enterococcus species, Streptococcus agalactiae, Streptococcus pyogenes, or Streptococcus pneumoniae. CRITICAL RESULT CALLED TO, READ BACK BY AND VERIFIED WITH:  KAREN HAYES AT 1358 06/09/17 SDR    Streptococcus agalactiae NOT DETECTED NOT DETECTED Final   Streptococcus pneumoniae NOT DETECTED NOT DETECTED Final   Streptococcus pyogenes NOT DETECTED NOT DETECTED Final   Acinetobacter baumannii NOT DETECTED NOT DETECTED Final   Enterobacteriaceae species NOT DETECTED NOT DETECTED Final   Enterobacter cloacae complex NOT DETECTED NOT DETECTED Final   Escherichia coli NOT DETECTED NOT DETECTED Final   Klebsiella oxytoca NOT DETECTED NOT DETECTED Final   Klebsiella pneumoniae NOT DETECTED NOT DETECTED Final   Proteus species NOT DETECTED NOT DETECTED Final   Serratia marcescens NOT DETECTED NOT DETECTED Final   Haemophilus influenzae NOT DETECTED NOT DETECTED Final   Neisseria meningitidis NOT DETECTED NOT DETECTED Final   Pseudomonas  aeruginosa NOT DETECTED NOT DETECTED Final   Candida albicans NOT DETECTED NOT DETECTED Final   Candida glabrata NOT DETECTED NOT DETECTED Final   Candida krusei NOT DETECTED NOT DETECTED Final   Candida parapsilosis NOT DETECTED NOT DETECTED Final   Candida tropicalis NOT DETECTED NOT DETECTED Final    Comment: Performed at [facility], [address] [place], [place] [address]  Urine culture     Status: Abnormal   Collection Time: 06/08/17  8:21 PM  Result Value Ref Range Status   Specimen Description   Final    URINE, RANDOM Performed at [facility], [address] [place], [place] [address]    Special Requests   Final    NONE Performed at [facility], [address] [place], [place] [address]    Culture (A)  Final    <10,000 COLONIES/mL INSIGNIFICANT GROWTH Performed at [facility], [address] [place], [place] [address]    Report Status 06/10/2017 FINAL  Final  MRSA PCR Screening     Status: None   Collection Time: 06/09/17 12:05 AM  Result Value Ref Range Status   MRSA by PCR NEGATIVE NEGATIVE Final    Comment:        The GeneXpert MRSA Assay (FDA approved for NASAL specimens only), is one component of a comprehensive MRSA colonization surveillance program. It is not intended to diagnose MRSA infection nor to guide or monitor treatment for MRSA infections. Performed at [facility], [address] [place], [place] [address]     RADIOLOGY:  No results found.  Follow up with PCP in 1 week.  Management plans discussed with the patient, family and they are in agreement.  CODE STATUS:     Code Status Orders  (From admission, onward)        Start     Ordered   06/08/17 2228  Do not attempt resuscitation (DNR)  Continuous    Question Answer Comment  In the event of cardiac or respiratory ARREST Do not call a "code blue"   In the event of cardiac or respiratory ARREST Do not perform Intubation, CPR,  defibrillation or ACLS   In the event of cardiac or respiratory ARREST Use medication by any route, position,  wound care, and other measures to relive pain and suffering. May use oxygen, suction and manual treatment of airway obstruction as needed for comfort.      06/08/17 2227    Code Status History    Date Active Date Inactive Code Status Order ID Comments User Context   06/08/2017 2054 06/08/2017 2227 DNR [id number]  Loleta Rose, MD ED    Advance Directive Documentation     Most Recent Value  Type of Advance Directive  Healthcare Power of Attorney  Pre-existing out of facility DNR order (yellow form or pink MOST form)  -  "MOST" Form in Place?  -      TOTAL TIME TAKING CARE OF THIS PATIENT ON DAY OF DISCHARGE: more than 30 minutes.   Molinda Bailiff Larz Mark M.D on 06/11/2017 at 1:08 PM  Between 7am to 6pm - Pager - [phone]  After 6pm go to [email or web address] - password [email or web address]  [facility] [place] [facility]  Office  [phone]  CC: Primary care physician; Patient, No Pcp Per  Note: This dictation was prepared with Dragon dictation along with smaller phrase technology. Any transcriptional errors that result from this process are unintentional.

## 2017-06-11 NOTE — Progress Notes (Signed)
PT Cancellation Note  Patient Details Name: Norma SanteeMildred L Perez MRN: [id number] DOB: [birth year]   Cancelled Treatment:      PT consult received, chart reviewed. Pt K+ noted to be low (2.8).  Per PT guidelines, Pt not appropriate  for PT at this time, due to K+ (2.8).  Will reattempt treatment at a later date/time when medically appropriate.   Rhian Funari Mondrian-Pardue, SPT 06/11/2017, 9:07 AM

## 2017-06-11 NOTE — Care Management Important Message (Deleted)
Important Message  Patient Details  Name: Norma Perez MRN: [id number] Date of Birth: 1916/06/23   Medicare Important Message Given:  Yes  Patient was unable to sign and no family in the room.  I didn't see my note so re-documenting.   [name] 06/11/2017, 1:48 PM

## 2017-06-11 NOTE — Progress Notes (Deleted)
Patient qualifies for hospice with her dementia

## 2017-06-11 NOTE — [place] FL2 (Addendum)
[place] MEDICAID FL2 LEVEL OF CARE SCREENING TOOL     IDENTIFICATION  Patient Name: Norma Perez Birthdate: 02-Jul-1916 Sex: female Admission Date (Current Location): 06/08/2017  [place]   Facility and Address:  [facility], [address], [place], [place] [address]      Provider Number: [id number]  Attending Physician Name and Address:  Milagros LollSudini, Srikar, MD  Relative Name and Phone Number:  Gevena Marterry,James Son   [phone] or Auten,Jondeen Relative [phone] or Cammie Sickleerry,Mike Son [phone]     Current Level of Care: Hospital Recommended Level of Care: Assisted Living Facility([facility]) Prior Approval Number:    Date Approved/Denied:   PASRR Number:    Discharge Plan: Other (Comment)([facility])    Current Diagnoses: Patient Active Problem List   Diagnosis Date Noted  . Protein-calorie malnutrition, severe 06/10/2017  . Pneumonia 06/08/2017    Orientation RESPIRATION BLADDER Height & Weight     Self  Normal Incontinent Weight: 105 lb 9.6 oz (47.9 kg) Height:  5\' 5"  (165.1 cm)  BEHAVIORAL SYMPTOMS/MOOD NEUROLOGICAL BOWEL NUTRITION STATUS      Incontinent Diet(Carb Modifed)  AMBULATORY STATUS COMMUNICATION OF NEEDS Skin   Supervision Verbally Normal                       Personal Care Assistance Level of Assistance  Bathing, Feeding, Dressing Bathing Assistance: Limited assistance Feeding assistance: Limited assistance Dressing Assistance: Limited assistance     Functional Limitations Info  Sight, Hearing, Speech Sight Info: Impaired Hearing Info: Impaired Speech Info: Adequate    SPECIAL CARE FACTORS FREQUENCY  PT (By licensed PT)     PT Frequency: Home Health PT minimum 2x a week              Contractures Contractures Info: Not present    Additional Factors Info  Code Status, Allergies Code Status Info: DNR Allergies Info: NKA           Current Medications  (06/11/2017):  This is the current hospital active medication list Current Facility-Administered Medications  Medication Dose Route Frequency Provider Last Rate Last Dose  . acetaminophen (TYLENOL) tablet 650 mg  650 mg Oral Q6H PRN Ihor AustinPyreddy, Pavan, MD       Or  . acetaminophen (TYLENOL) suppository 650 mg  650 mg Rectal Q6H PRN Pyreddy, Pavan, MD      . cefTRIAXone (ROCEPHIN) 2 g in sodium chloride 0.9 % 100 mL IVPB  2 g Intravenous Q24H Cindi CarbonSwayne, Mary M, [facility]   Stopped at 06/10/17 1753  . enoxaparin (LOVENOX) injection 30 mg  30 mg Subcutaneous Q24H Ihor AustinPyreddy, Pavan, MD   30 mg at 06/10/17 2143  . feeding supplement (ENSURE ENLIVE) (ENSURE ENLIVE) liquid 237 mL  237 mL Oral BID BM Sudini, Srikar, MD   237 mL at 06/11/17 1415  . haloperidol lactate (HALDOL) injection 1 mg  1 mg Intravenous Q6H PRN Oralia ManisWillis, David, MD      . MEDLINE mouth rinse  15 mL Mouth Rinse BID Milagros LollSudini, Srikar, MD   15 mL at 06/11/17 0944  . ondansetron (ZOFRAN) tablet 4 mg  4 mg Oral Q6H PRN Pyreddy, Vivien RotaPavan, MD       Or  . ondansetron (ZOFRAN) injection 4 mg  4 mg Intravenous Q6H PRN Pyreddy, Pavan, MD      . senna-docusate (Senokot-S) tablet 1 tablet  1 tablet Oral QHS PRN Ihor AustinPyreddy, Pavan, MD  Discharge Medications: Allergies as of 06/11/2017   No Known Allergies        Medication List    TAKE these medications   cefUROXime 500 MG tablet Commonly known as:  CEFTIN Take 1 tablet (500 mg total) by mouth 2 (two) times daily for 7 days.      Relevant Imaging Results:  Relevant Lab Results:   Additional Information SSN [id number]  Darleene Cleaver, [place]

## 2017-06-11 NOTE — Progress Notes (Signed)
[facility] discharged to [facility] per MD order. Report called to ChamizalBonnie at facility. Family will transport to facility.

## 2017-06-13 LAB — CULTURE, BLOOD (ROUTINE X 2): CULTURE: NO GROWTH

## 2017-06-16 ENCOUNTER — Telehealth: Payer: Self-pay

## 2017-06-16 NOTE — Telephone Encounter (Signed)
EMMI Follow-up: Talked with patients son and he said she was doing as well as expected.  He helped her with lunch today and has no concerns at the moment.  I explained he would receive one more automated call so if he had concerns to let us know.  He thanked us for calling to check on her.

## 2017-06-19 DIAGNOSIS — Z515 Encounter for palliative care: Secondary | ICD-10-CM

## 2017-06-19 HISTORY — DX: Encounter for palliative care: Z51.5

## 2017-11-23 ENCOUNTER — Other Ambulatory Visit: Payer: Self-pay

## 2017-11-23 ENCOUNTER — Emergency Department
Admission: EM | Admit: 2017-11-23 | Discharge: 2017-11-24 | Disposition: A | Discharge status: Home / Self Care | Payer: Medicare Other | Attending: Emergency Medicine | Admitting: Emergency Medicine

## 2017-11-23 ENCOUNTER — Encounter: Payer: Self-pay | Admitting: Emergency Medicine

## 2017-11-23 ENCOUNTER — Emergency Department: Payer: Medicare Other

## 2017-11-23 DIAGNOSIS — N3000 Acute cystitis without hematuria: Secondary | ICD-10-CM | POA: Diagnosis not present

## 2017-11-23 DIAGNOSIS — Z79899 Other long term (current) drug therapy: Secondary | ICD-10-CM | POA: Insufficient documentation

## 2017-11-23 DIAGNOSIS — R55 Syncope and collapse: Secondary | ICD-10-CM | POA: Diagnosis present

## 2017-11-23 DIAGNOSIS — W19XXXA Unspecified fall, initial encounter: Secondary | ICD-10-CM

## 2017-11-23 LAB — CBC
HCT: 33.9 % — ABNORMAL LOW (ref 35.0–47.0)
HEMOGLOBIN: 12.1 g/dL (ref 12.0–16.0)
MCH: 40.6 pg — AB (ref 26.0–34.0)
MCHC: 35.6 g/dL (ref 32.0–36.0)
MCV: 114.1 fL — ABNORMAL HIGH (ref 80.0–100.0)
Platelets: 166 10*3/uL (ref 150–440)
RBC: 2.97 MIL/uL — ABNORMAL LOW (ref 3.80–5.20)
RDW: 14.5 % (ref 11.5–14.5)
WBC: 12.6 10*3/uL — ABNORMAL HIGH (ref 3.6–11.0)

## 2017-11-23 LAB — COMPREHENSIVE METABOLIC PANEL
ALT: 12 U/L (ref 0–44)
AST: 33 U/L (ref 15–41)
Albumin: 3.2 g/dL — ABNORMAL LOW (ref 3.5–5.0)
Alkaline Phosphatase: 51 U/L (ref 38–126)
Anion gap: 8 (ref 5–15)
BILIRUBIN TOTAL: 1.4 mg/dL — AB (ref 0.3–1.2)
BUN: 15 mg/dL (ref 8–23)
CO2: 25 mmol/L (ref 22–32)
CREATININE: 0.46 mg/dL (ref 0.44–1.00)
Calcium: 8.1 mg/dL — ABNORMAL LOW (ref 8.9–10.3)
Chloride: 104 mmol/L (ref 98–111)
GFR calc Af Amer: >60 mL/min (ref >60)
GLUCOSE: 116 mg/dL — AB (ref 70–99)
POTASSIUM: 4.2 mmol/L (ref 3.5–5.1)
SODIUM: 137 mmol/L (ref 135–145)
TOTAL PROTEIN: 6.5 g/dL (ref 6.5–8.1)

## 2017-11-23 LAB — TROPONIN I: TROPONIN I: 0.03 ng/mL — AB (ref <0.03)

## 2017-11-23 NOTE — ED Triage Notes (Signed)
Pt via ems from [facility] [place] after unwitnessed fall. She was found on the floor conscious with some clothes off and some on. EMS reports no hematoma or bleeding found. Pt alert, not responsive to questions. EMS reported that pt's right eye is always closed.

## 2017-11-24 LAB — URINALYSIS, COMPLETE (UACMP) WITH MICROSCOPIC
BILIRUBIN URINE: NEGATIVE
Glucose, UA: NEGATIVE mg/dL
HGB URINE DIPSTICK: NEGATIVE
Ketones, ur: NEGATIVE mg/dL
NITRITE: NEGATIVE
PH: 6 (ref 5.0–8.0)
Protein, ur: NEGATIVE mg/dL
SPECIFIC GRAVITY, URINE: 1.012 (ref 1.005–1.030)
WBC, UA: >50 WBC/hpf — ABNORMAL HIGH (ref 0–5)

## 2017-11-24 MED ORDER — SODIUM CHLORIDE 0.9 % IV SOLN
1.0000 g | Freq: Once | INTRAVENOUS | Status: AC
  Administered 2017-11-24: 1 g via INTRAVENOUS
  Filled 2017-11-24: qty 10

## 2017-11-24 MED ORDER — CEPHALEXIN 500 MG PO CAPS: 500.0000 mg | ORAL_CAPSULE | Freq: Two times a day (BID) | ORAL | 0 refills | Status: AC

## 2017-11-24 NOTE — Discharge Instructions (Signed)
Please have the urinalysis repeated in 7-10 days.   Return to the ER for symptoms of concern if unable to schedule an appointment or see primary care provider.

## 2017-11-24 NOTE — ED Provider Notes (Signed)
[facility] Emergency Department Provider Note  ____________________________________________   None    (approximate)  I have reviewed the triage vital signs and the nursing notes.   HISTORY  Chief Complaint Fall   HPI Norma Perez is a 84 y.o. female who presents to the emergency department for treatment and evaluation after she was found on the floor with some of her close on some of her clothes off.  She is a resident at the home place of [place].  EMS arrived and did not find any injury.  Patient was alert but not answering questions per EMS.  Past Medical History:  Diagnosis Date  . Hard of hearing     Patient Active Problem List   Diagnosis Date Noted  . Protein-calorie malnutrition, severe 06/10/2017  . Pneumonia 06/08/2017    Past Surgical History:  Procedure Laterality Date  . ABDOMINAL HYSTERECTOMY      Prior to Admission medications   Medication Sig Start Date End Date Taking? Authorizing Provider  acetaminophen (TYLENOL) 500 MG tablet Take 1,000 mg by mouth every 8 (eight) hours as needed for mild pain or fever.   Yes [provider]  cyanocobalamin 1000 MCG tablet Take 1,000 mcg by mouth daily.   Yes [provider]  Emollient (CERAVE) CREA Apply 1 application topically 2 (two) times daily. Apply to bilateral lower extremities   Yes [provider]  mirtazapine (REMERON) 15 MG tablet Take 15 mg by mouth at bedtime.   Yes [provider]  Skin Protectants, Misc. (ENDIT EX) Apply 1 application topically 3 (three) times daily.   Yes [provider]  triamcinolone cream (KENALOG) 0.1 % Apply 1 application topically 2 (two) times daily as needed (itching).   Yes [provider]  cephALEXin (KEFLEX) 500 MG capsule Take 1 capsule (500 mg total) by mouth 2 (two) times daily for 10 days. 11/24/17 12/04/17  Chinita Pester, FNP    Allergies Patient has no known allergies.  History  reviewed. No pertinent family history.  Social History Social History   Tobacco Use  . Smoking status: Never Smoker  . Smokeless tobacco: Never Used  Substance Use Topics  . Alcohol use: Never    Frequency: Never  . Drug use: Never    Review of Systems level 5 caveat: Dementia  ____________________________________________   PHYSICAL EXAM:  VITAL SIGNS: ED Triage Vitals  Enc Vitals Group     BP 11/23/17 2257 (!) 159/91     Pulse Rate 11/23/17 2257 (!) 114     Resp 11/23/17 2257 18     Temp 11/23/17 2257 99.8 F (37.7 C)     Temp Source 11/23/17 2257 Oral     SpO2 11/23/17 2257 96 %     Weight 11/23/17 2259 105 lb 9.6 oz (47.9 kg)     Height 11/23/17 2259 5\' 2"  (1.575 m)     Head Circumference --      Peak Flow --      Pain Score --      Pain Loc --      Pain Edu? --      Excl. in GC? --     Constitutional: Alert.  Disoriented to place and time. Eyes: Conjunctivae are normal.  Head: Atraumatic. No battle sign or racoon eyes. Nose: No congestion/rhinnorhea.  No epistaxis. Mouth/Throat: Mucous membranes are moist.   Neck: No stridor.   Cardiovascular: Tachycardia. Grossly normal heart sounds.  Good peripheral circulation. Respiratory: Normal respiratory  effort.  No retractions. Lungs CTAB.  Gastrointestinal: Soft and nontender. No distention. No abdominal bruits. No CVA tenderness. Musculoskeletal: No lower extremity tenderness nor edema.  No joint effusions. Neurologic:  Normal speech and language. No gross focal neurologic deficits are appreciated. No gait instability. Skin:  Skin is warm, dry and intact. No rash noted. Psychiatric: Mood and affect are normal. Speech and behavior are normal.  ____________________________________________   LABS (all labs ordered are listed, but only abnormal results are displayed)  Labs Reviewed  CBC - Abnormal; Notable for the following components:      Result Value   WBC 12.6 (*)    RBC 2.97 (*)    HCT 33.9 (*)    MCV  114.1 (*)    MCH 40.6 (*)    All other components within normal limits  COMPREHENSIVE METABOLIC PANEL - Abnormal; Notable for the following components:   Glucose, Bld 116 (*)    Calcium 8.1 (*)    Albumin 3.2 (*)    Total Bilirubin 1.4 (*)    All other components within normal limits  TROPONIN I - Abnormal; Notable for the following components:   Troponin I 0.03 (*)    All other components within normal limits  URINALYSIS, COMPLETE (UACMP) WITH MICROSCOPIC - Abnormal; Notable for the following components:   Color, Urine YELLOW (*)    APPearance HAZY (*)    Leukocytes, UA LARGE (*)    WBC, UA >50 (*)    Bacteria, UA FEW (*)    All other components within normal limits   ____________________________________________  EKG  Sinus tachycardia with a ventricular rate of 105 bpm, normal QRS, QTc, occasional PAC no ST segment elevation or reciprocal change. ____________________________________________  RADIOLOGY  ED MD interpretation: CT head and cervical spine are reassuring  Official radiology report(s): Ct Head Wo Contrast  Result Date: 11/23/2017 CLINICAL DATA:  84 year old post unwitnessed fall. Head trauma, subacute, neuro/cognitive deficit; C-spine trauma, ligamentous injury suspected. EXAM: CT HEAD WITHOUT CONTRAST CT CERVICAL SPINE WITHOUT CONTRAST TECHNIQUE: Multidetector CT imaging of the head and cervical spine was performed following the standard protocol without intravenous contrast. Multiplanar CT image reconstructions of the cervical spine were also generated. COMPARISON:  CT 10/20/2016 FINDINGS: CT HEAD FINDINGS Brain: Unchanged degree of atrophy and chronic small vessel ischemia. 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 No intracranial hemorrhage, mass effect, or midline shift. No hydrocephalus. The basilar cisterns are patent. No evidence of territorial infarct or acute ischemia. No extra-axial or intracranial fluid collection. Vascular: Atherosclerosis of  skullbase vasculature without hyperdense vessel or abnormal calcification. Skull: No fracture or focal lesion. Sinuses/Orbits: Chronic right globe deformity and lateral gaze. Probable left cataract resection. Minimal mucosal thickening of right ethmoid air cells without fluid level. Mastoid air cells are clear. Other: None. CT CERVICAL SPINE FINDINGS Alignment: Normal. Skull base and vertebrae: No acute fracture. Vertebral body heights are maintained. The dens and skull base are intact. Soft tissues and spinal canal: No prevertebral fluid or swelling. No visible canal hematoma. Disc levels: Multilevel disc space narrowing and endplate spurring. Scattered facet arthropathy. Degenerative changes are stable from prior. Upper chest: Biapical pleuroparenchymal scarring, unchanged. Other: Carotid calcifications. IMPRESSION: 1.  No acute intracranial abnormality.  No skull fracture. 2. Unchanged atrophy, chronic small vessel ischemia, and remote right occipital infarct. 3. Mild for age degenerative change in the cervical spine without acute fracture. Electronically Signed   By: Narda Rutherford M.D.   On: 11/23/2017 23:59   Ct Cervical  Spine Wo Contrast  Result Date: 11/23/2017 CLINICAL DATA:  84 year old post unwitnessed fall. Head trauma, subacute, neuro/cognitive deficit; C-spine trauma, ligamentous injury suspected. EXAM: CT HEAD WITHOUT CONTRAST CT CERVICAL SPINE WITHOUT CONTRAST TECHNIQUE: Multidetector CT imaging of the head and cervical spine was performed following the standard protocol without intravenous contrast. Multiplanar CT image reconstructions of the cervical spine were also generated. COMPARISON:  CT 10/20/2016 FINDINGS: CT HEAD FINDINGS Brain: Unchanged degree of atrophy and chronic small vessel ischemia. 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 No intracranial hemorrhage, mass effect, or midline shift. No hydrocephalus. The basilar cisterns are patent. No evidence of territorial  infarct or acute ischemia. No extra-axial or intracranial fluid collection. Vascular: Atherosclerosis of skullbase vasculature without hyperdense vessel or abnormal calcification. Skull: No fracture or focal lesion. Sinuses/Orbits: Chronic right globe deformity and lateral gaze. Probable left cataract resection. Minimal mucosal thickening of right ethmoid air cells without fluid level. Mastoid air cells are clear. Other: None. CT CERVICAL SPINE FINDINGS Alignment: Normal. Skull base and vertebrae: No acute fracture. Vertebral body heights are maintained. The dens and skull base are intact. Soft tissues and spinal canal: No prevertebral fluid or swelling. No visible canal hematoma. Disc levels: Multilevel disc space narrowing and endplate spurring. Scattered facet arthropathy. Degenerative changes are stable from prior. Upper chest: Biapical pleuroparenchymal scarring, unchanged. Other: Carotid calcifications. IMPRESSION: 1.  No acute intracranial abnormality.  No skull fracture. 2. Unchanged atrophy, chronic small vessel ischemia, and remote right occipital infarct. 3. Mild for age degenerative change in the cervical spine without acute fracture. Electronically Signed   By: Narda Rutherford M.D.   On: 11/23/2017 23:59    ____________________________________________   PROCEDURES  Procedure(s) performed: None  Procedures  Critical Care performed: No  ____________________________________________   INITIAL IMPRESSION / ASSESSMENT AND PLAN / ED COURSE  As part of my medical decision making, I reviewed the following data within the electronic MEDICAL RECORD NUMBER   84 year old female presenting to the emergency department after an unwitnessed fall.  Although there was no evidence of injury, she was brought to the emergency department for evaluation.  CT of the head and cervical spine is reassuring.  Urinalysis is consistent with acute cystitis and she was given 1 g of Rocephin IV while here in the  department.  She will be placed on Keflex as an outpatient.  She is to have her urine rechecked in 7 to 10 days or sooner if she begins to have other symptoms such as back pain or fever.  These instructions were given to the son and will be written on her discharge paperwork as well.      ____________________________________________   FINAL CLINICAL IMPRESSION(S) / ED DIAGNOSES  Final diagnoses:  Acute cystitis without hematuria  Fall, initial encounter     ED Discharge Orders         Ordered    cephALEXin (KEFLEX) 500 MG capsule  2 times daily     11/24/17 0148           Note:  This document was prepared using Dragon voice recognition software and may include unintentional dictation errors.    Chinita Pester, FNP 11/24/17 [id number]    Merrily Brittle, MD 11/25/17 0001

## 2017-11-24 NOTE — ED Notes (Signed)
Report called to Marchelle Folks at [facility], awaiting EMS transport.

## 2017-11-24 NOTE — ED Notes (Signed)
Patient discharged to home per MD order. Patient in stable condition, and deemed medically cleared by ED provider for discharge. Discharge instructions reviewed with patient/family using "Teach Back"; verbalized understanding of medication education and administration, and information about follow-up care. Denies further concerns. ° °

## 2017-11-25 ENCOUNTER — Emergency Department: Payer: Medicare Other

## 2017-11-25 ENCOUNTER — Other Ambulatory Visit: Payer: Self-pay

## 2017-11-25 ENCOUNTER — Emergency Department
Admission: EM | Admit: 2017-11-25 | Discharge: 2017-11-26 | Disposition: A | Discharge status: Home / Self Care | Payer: Medicare Other | Attending: Emergency Medicine | Admitting: Emergency Medicine

## 2017-11-25 DIAGNOSIS — S0003XA Contusion of scalp, initial encounter: Secondary | ICD-10-CM | POA: Insufficient documentation

## 2017-11-25 DIAGNOSIS — W01198A Fall on same level from slipping, tripping and stumbling with subsequent striking against other object, initial encounter: Secondary | ICD-10-CM | POA: Diagnosis not present

## 2017-11-25 DIAGNOSIS — Y92121 Bathroom in nursing home as the place of occurrence of the external cause: Secondary | ICD-10-CM | POA: Diagnosis not present

## 2017-11-25 DIAGNOSIS — Y999 Unspecified external cause status: Secondary | ICD-10-CM | POA: Diagnosis not present

## 2017-11-25 DIAGNOSIS — Z79899 Other long term (current) drug therapy: Secondary | ICD-10-CM | POA: Insufficient documentation

## 2017-11-25 DIAGNOSIS — S0990XA Unspecified injury of head, initial encounter: Secondary | ICD-10-CM | POA: Diagnosis present

## 2017-11-25 DIAGNOSIS — Y93E8 Activity, other personal hygiene: Secondary | ICD-10-CM | POA: Diagnosis not present

## 2017-11-25 DIAGNOSIS — W19XXXA Unspecified fall, initial encounter: Secondary | ICD-10-CM

## 2017-11-25 NOTE — ED Provider Notes (Signed)
[facility] Emergency Department Provider Note  ____________________________________________  Time seen: Approximately 11:00 PM  I have reviewed the triage vital signs and the nursing notes.   HISTORY  Chief Complaint Fall  Level 5 Caveat: Portions of the History and Physical including HPI and review of systems are unable to be completely obtained due to patient being a poor historian due to advanced dementia   HPI Norma Perez is a 84 y.o. female sent to the ED for evaluation from home place after a fall.  She was in the shower being assisted by 2 staff members when she fell and hit the back of her head.  No loss of consciousness.  Patient denies any pain complaints at this time.  No neck pain.  No other symptoms reported.      Past Medical History:  Diagnosis Date  . Hard of hearing      Patient Active Problem List   Diagnosis Date Noted  . Protein-calorie malnutrition, severe 06/10/2017  . Pneumonia 06/08/2017     Past Surgical History:  Procedure Laterality Date  . ABDOMINAL HYSTERECTOMY       Prior to Admission medications   Medication Sig Start Date End Date Taking? Authorizing Provider  acetaminophen (TYLENOL) 500 MG tablet Take 1,000 mg by mouth every 8 (eight) hours as needed for mild pain or fever.    [provider]  cephALEXin (KEFLEX) 500 MG capsule Take 1 capsule (500 mg total) by mouth 2 (two) times daily for 10 days. 11/24/17 12/04/17  Triplett, Rulon Eisenmenger B, FNP  cyanocobalamin 1000 MCG tablet Take 1,000 mcg by mouth daily.    [provider]  Emollient (CERAVE) CREA Apply 1 application topically 2 (two) times daily. Apply to bilateral lower extremities    [provider]  mirtazapine (REMERON) 15 MG tablet Take 15 mg by mouth at bedtime.    [provider]  Skin Protectants, Misc. (ENDIT EX) Apply 1 application topically 3 (three) times daily.    [provider]  triamcinolone cream  (KENALOG) 0.1 % Apply 1 application topically 2 (two) times daily as needed (itching).    [provider]     Allergies Patient has no known allergies.   No family history on file.  Social History Social History   Tobacco Use  . Smoking status: Never Smoker  . Smokeless tobacco: Never Used  Substance Use Topics  . Alcohol use: Never    Frequency: Never  . Drug use: Never    Review of Systems  Level 5 Caveat: Portions of the History and Physical including HPI and review of systems are unable to be completely obtained due to patient being a poor historian  ____________________________________________   PHYSICAL EXAM:  VITAL SIGNS: ED Triage Vitals  Enc Vitals Group     BP 11/25/17 2026 (!) 192/92     Pulse Rate 11/25/17 2026 (!) 106     Resp 11/25/17 2026 18     Temp 11/25/17 2026 99 F (37.2 C)     Temp Source 11/25/17 2026 Oral     SpO2 11/25/17 2026 99 %     Weight 11/25/17 2027 105 lb 9.6 oz (47.9 kg)     Height --      Head Circumference --      Peak Flow --      Pain Score --      Pain Loc --      Pain Edu? --      Excl.  in GC? --     Vital signs reviewed, nursing assessments reviewed.   Constitutional:   Awake and alert, not oriented.. Non-toxic appearance. Eyes:   Conjunctivae are normal. EOMI. PERRL. ENT      Head:   Normocephalic with 2 cm scalp hematoma posteriorly, no laceration.  Hemostatic.Marland Kitchen      Nose:   No congestion/rhinnorhea.       Mouth/Throat:   MMM, no pharyngeal erythema. No peritonsillar mass.       Neck:   No meningismus. Full ROM.  No midline tenderness Hematological/Lymphatic/Immunilogical:   No cervical lymphadenopathy. Cardiovascular:   RRR. Symmetric bilateral radial and DP pulses.  No murmurs. Cap refill less than 2 seconds. Respiratory:   Normal respiratory effort without tachypnea/retractions. Breath sounds are clear and equal bilaterally. No wheezes/rales/rhonchi. Gastrointestinal:   Soft and nontender. Non  distended. There is no CVA tenderness.  No rebound, rigidity, or guarding. Musculoskeletal:   Normal range of motion in all extremities. No joint effusions.  No lower extremity tenderness.  No edema. Neurologic:   Normal speech and language.  Motor grossly intact. No acute focal neurologic deficits are appreciated.  Skin:    Skin is warm, dry and intact. No rash noted.  No petechiae, purpura, or bullae.  ____________________________________________    LABS (pertinent positives/negatives) (all labs ordered are listed, but only abnormal results are displayed) Labs Reviewed - No data to display ____________________________________________   EKG    ____________________________________________    RADIOLOGY  Ct Head Wo Contrast  Result Date: 11/25/2017 CLINICAL DATA:  Status post fall. EXAM: CT HEAD WITHOUT CONTRAST CT CERVICAL SPINE WITHOUT CONTRAST TECHNIQUE: Multidetector CT imaging of the head and cervical spine was performed following the standard protocol without intravenous contrast. Multiplanar CT image reconstructions of the cervical spine were also generated. COMPARISON:  November 23, 2017 FINDINGS: CT HEAD FINDINGS Brain: No evidence of acute infarction, hemorrhage, hydrocephalus, extra-axial collection or mass lesion/mass effect. There is chronic diffuse atrophy. Chronic bilateral periventricular white matter small vessel ischemic changes identified. Encephalomalacia of the right occipital lobe is identified. There is motion artifact projected over the left frontal extra-axial space on series 3 but on repeat scanning of the same area on series 6, the finding has disappeared. Vascular: No hyperdense vessel or unexpected calcification. Skull: Normal. Negative for fracture or focal lesion. Sinuses/Orbits: Chronic change of the right maxillary sinus is noted. The orbits are stable. Other: There is high occipital scalp hematoma. CT CERVICAL SPINE FINDINGS Alignment: There is exaggerated  lordosis of the cervical spine. Skull base and vertebrae: No acute fracture. No primary bone lesion or focal pathologic process. Soft tissues and spinal canal: No prevertebral fluid or swelling. No visible canal hematoma. Disc levels: Degenerative joint changes with narrowed joint space are identified throughout cervical spine. Upper chest: Biapical pleural thickening and scarring are unchanged. Other: None. IMPRESSION: High occipital scalp hematoma. No focal acute intracranial abnormality identified. Chronic diffuse atrophy. Chronic bilateral periventricular white matter small vessel ischemic change. No acute fracture or dislocation of cervical spine. Electronically Signed   By: Sherian Rein M.D.   On: 11/25/2017 21:28   Ct Cervical Spine Wo Contrast  Result Date: 11/25/2017 CLINICAL DATA:  Status post fall. EXAM: CT HEAD WITHOUT CONTRAST CT CERVICAL SPINE WITHOUT CONTRAST TECHNIQUE: Multidetector CT imaging of the head and cervical spine was performed following the standard protocol without intravenous contrast. Multiplanar CT image reconstructions of the cervical spine were also generated. COMPARISON:  November 23, 2017 FINDINGS: CT HEAD FINDINGS  Brain: No evidence of acute infarction, hemorrhage, hydrocephalus, extra-axial collection or mass lesion/mass effect. There is chronic diffuse atrophy. Chronic bilateral periventricular white matter small vessel ischemic changes identified. Encephalomalacia of the right occipital lobe is identified. There is motion artifact projected over the left frontal extra-axial space on series 3 but on repeat scanning of the same area on series 6, the finding has disappeared. Vascular: No hyperdense vessel or unexpected calcification. Skull: Normal. Negative for fracture or focal lesion. Sinuses/Orbits: Chronic change of the right maxillary sinus is noted. The orbits are stable. Other: There is high occipital scalp hematoma. CT CERVICAL SPINE FINDINGS Alignment: There is  exaggerated lordosis of the cervical spine. Skull base and vertebrae: No acute fracture. No primary bone lesion or focal pathologic process. Soft tissues and spinal canal: No prevertebral fluid or swelling. No visible canal hematoma. Disc levels: Degenerative joint changes with narrowed joint space are identified throughout cervical spine. Upper chest: Biapical pleural thickening and scarring are unchanged. Other: None. IMPRESSION: High occipital scalp hematoma. No focal acute intracranial abnormality identified. Chronic diffuse atrophy. Chronic bilateral periventricular white matter small vessel ischemic change. No acute fracture or dislocation of cervical spine. Electronically Signed   By: Sherian Rein M.D.   On: 11/25/2017 21:28    ____________________________________________   PROCEDURES Procedures  ____________________________________________  DIFFERENTIAL DIAGNOSIS   Subdural hematoma, epidural hematoma, C-spine fracture.  CLINICAL IMPRESSION / ASSESSMENT AND PLAN / ED COURSE  Pertinent labs & imaging results that were available during my care of the patient were reviewed by me and considered in my medical decision making (see chart for details).    Patient not in distress, presents with blunt trauma to the head witnessed fall.  No acute symptoms.  CT of the head and neck negative for acute traumatic injury.  Stable for discharge home to follow-up with primary care.      ____________________________________________   FINAL CLINICAL IMPRESSION(S) / ED DIAGNOSES    Final diagnoses:  Fall, initial encounter  Injury of head, initial encounter  Contusion of scalp, initial encounter     ED Discharge Orders    None      Portions of this note were generated with dragon dictation software. Dictation errors may occur despite best attempts at proofreading.    Sharman Cheek, MD 11/25/17 [phone]

## 2017-11-25 NOTE — ED Notes (Signed)
EMS called and talked with Norma Perez.

## 2017-11-25 NOTE — Discharge Instructions (Addendum)
Your scans of the head and neck today did not show any acute injuries other than the scalp hematoma and swelling.

## 2017-11-25 NOTE — ED Notes (Signed)
Patient waiting for EMS transport.

## 2017-11-25 NOTE — ED Triage Notes (Signed)
Pt brought in by [facility] from [facility], staff was giving her a shower when she fell. Hitting back of head on shower, large hematoma noted. Pt is demented, mental status is normal to patient.

## 2018-01-04 ENCOUNTER — Emergency Department
Admission: EM | Admit: 2018-01-04 | Discharge: 2018-01-04 | Disposition: A | Discharge status: Home / Self Care | Payer: Medicare Other | Attending: Emergency Medicine | Admitting: Emergency Medicine

## 2018-01-04 ENCOUNTER — Other Ambulatory Visit: Payer: Self-pay

## 2018-01-04 ENCOUNTER — Encounter: Payer: Self-pay | Admitting: Emergency Medicine

## 2018-01-04 DIAGNOSIS — Z79899 Other long term (current) drug therapy: Secondary | ICD-10-CM | POA: Diagnosis not present

## 2018-01-04 DIAGNOSIS — L0291 Cutaneous abscess, unspecified: Secondary | ICD-10-CM

## 2018-01-04 DIAGNOSIS — L02811 Cutaneous abscess of head [any part, except face]: Secondary | ICD-10-CM | POA: Diagnosis not present

## 2018-01-04 MED ORDER — LIDOCAINE-EPINEPHRINE 2 %-1:100000 IJ SOLN
INTRAMUSCULAR | Status: AC
  Administered 2018-01-04: 19:00:00
  Filled 2018-01-04: qty 1

## 2018-01-04 NOTE — ED Notes (Signed)
FIRST NURSE NOTE: pt was brought in from [facility] with her grandson with c/o wound not healing on the posterior head, was seen for same 10/14

## 2018-01-04 NOTE — ED Provider Notes (Signed)
[facility] Emergency Department Provider Note  __________________________________________   First MD Initiated Contact with Patient 01/04/18 1706     (approximate)  I have reviewed the triage vital signs and the nursing notes.   HISTORY  Chief Complaint Abscess  HPI Norma Perez is a 84 y.o. female with a history of hearing loss was presented to the emergency department with a scalp abscess.  She has no reports that the patient had the abscess drained approximate 1 week ago.  Norma Perez is here with the patient who referred me to the patient's son who says the patient has been on antibiotics as well.  However, despite this the abscess has grown back.  Patient without any complaints at this time.  No fevers reported.   Past Medical History:  Diagnosis Date  . Hard of hearing     Patient Active Problem List   Diagnosis Date Noted  . Protein-calorie malnutrition, severe 06/10/2017  . Pneumonia 06/08/2017    Past Surgical History:  Procedure Laterality Date  . ABDOMINAL HYSTERECTOMY      Prior to Admission medications   Medication Sig Start Date End Date Taking? Authorizing Provider  acetaminophen (TYLENOL) 500 MG tablet Take 1,000 mg by mouth every 8 (eight) hours as needed for mild pain or fever.    [provider]  cyanocobalamin 1000 MCG tablet Take 1,000 mcg by mouth daily.    [provider]  Emollient (CERAVE) CREA Apply 1 application topically 2 (two) times daily. Apply to bilateral lower extremities    [provider]  mirtazapine (REMERON) 15 MG tablet Take 15 mg by mouth at bedtime.    [provider]  Skin Protectants, Misc. (ENDIT EX) Apply 1 application topically 3 (three) times daily.    [provider]  triamcinolone cream (KENALOG) 0.1 % Apply 1 application topically 2 (two) times daily as needed (itching).    [provider]    Allergies Patient has no known allergies.  No  family history on file.  Social History Social History   Tobacco Use  . Smoking status: Never Smoker  . Smokeless tobacco: Never Used  Substance Use Topics  . Alcohol use: Never    Frequency: Never  . Drug use: Never    Review of Systems  Constitutional: No fever/chills Eyes: No visual changes. ENT: No sore throat. Cardiovascular: Denies chest pain. Respiratory: Denies shortness of breath. Gastrointestinal: No abdominal pain.  No nausea, no vomiting.  No diarrhea.  No constipation. Genitourinary: Negative for dysuria. Musculoskeletal: Negative for back pain. Skin: Negative for rash. Neurological: Negative for headaches, focal weakness or numbness.   ____________________________________________   PHYSICAL EXAM:  VITAL SIGNS: ED Triage Vitals  Enc Vitals Group     BP 01/04/18 1647 (!) 177/81     Pulse Rate 01/04/18 1647 77     Resp 01/04/18 1647 20     Temp 01/04/18 1647 98.4 F (36.9 C)     Temp Source 01/04/18 1647 Oral     SpO2 01/04/18 1647 100 %     Weight 01/04/18 1648 105 lb (47.6 kg)     Height 01/04/18 1648 5\' 2"  (1.575 m)     Head Circumference --      Peak Flow --      Pain Score 01/04/18 1648 0     Pain Loc --      Pain Edu? --      Excl. in GC? --     Constitutional: Alert  and oriented.  No distress Eyes: Conjunctivae are normal.  Head: Atraumatic. Nose: No congestion/rhinnorhea. Mouth/Throat: Mucous membranes are moist.  Neck: No stridor.   Cardiovascular: Normal rate, regular rhythm. Grossly normal heart sounds.  Good peripheral circulation. Respiratory: Normal respiratory effort.  No retractions. Lungs CTAB. Gastrointestinal: Soft and nontender. No distention.  Musculoskeletal: No lower extremity tenderness nor edema.  No joint effusions. Neurologic:  Normal speech and language. No gross focal neurologic deficits are appreciated. Skin:    4 cm diameter abscess which is fluctuant with a small amount of purulent drainage over the top  aspect.  No surrounding induration or erythema noted.  Psychiatric: Mood and affect are normal. Speech and behavior are normal.  ____________________________________________   LABS (all labs ordered are listed, but only abnormal results are displayed)  Labs Reviewed - No data to display ____________________________________________  EKG   ____________________________________________  RADIOLOGY   ____________________________________________   PROCEDURES  Procedure(s) performed:    Marland KitchenMarland KitchenIncision and Drainage Date/Time: 01/04/2018 7:28 PM Performed by: Myrna Blazer, MD Authorized by: Myrna Blazer, MD   Consent:    Consent obtained:  Verbal   Consent given by:  Patient   Risks discussed:  Bleeding, infection, incomplete drainage and pain   Alternatives discussed:  Alternative treatment, delayed treatment and observation Location:    Type:  Abscess   Location:  Head   Head location:  Scalp Pre-procedure details:    Skin preparation:  Chloraprep Anesthesia (see MAR for exact dosages):    Anesthesia method:  Local infiltration   Local anesthetic:  Lidocaine 1% WITH epi Procedure type:    Complexity:  Simple Procedure details:    Needle aspiration: no     Incision types:  Single straight   Incision depth:  Dermal   Scalpel blade:  11   Techniques: pt would not tolerate probing.  grabbing at abscess site.    Drainage:  Bloody and purulent   Drainage amount:  Copious   Wound treatment:  Wound left open   Packing materials:  1/4 in gauze   Amount 1/4":  5in Post-procedure details:    Patient tolerance of procedure:  Tolerated with difficulty    Critical Care performed:   ____________________________________________   INITIAL IMPRESSION / ASSESSMENT AND PLAN / ED COURSE  Pertinent labs & imaging results that were available during my care of the patient were reviewed by me and considered in my medical decision making (see chart for  details).  DDX: Abscess, cellulitis As part of my medical decision making, I reviewed the following data within the electronic MEDICAL RECORD NUMBER Notes from prior ED visits  ----------------------------------------- 7:30 PM on 01/04/2018 -----------------------------------------  Able to I&D the lesion in place packing.  Patient will continue with her antibiotics.  I believe that the reaccumulation of pus occurred because likely a small incision was made in the wound reclosed.  Unclear if packing was attempted initially.  Patient will need to have packing removed in 2 days.  We will continue home antibiotics.  Will be discharged at this time. ____________________________________________   FINAL CLINICAL IMPRESSION(S) / ED DIAGNOSES  Abscess.   NEW MEDICATIONS STARTED DURING THIS VISIT:  New Prescriptions   No medications on file     Note:  This document was prepared using Dragon voice recognition software and may include unintentional dictation errors.      Myrna Blazer, MD 01/04/18 [phone]

## 2018-01-04 NOTE — ED Notes (Signed)
Called [facility], report given to [place], med tech.

## 2018-01-04 NOTE — ED Notes (Signed)
I+D of head abcess performmed by Dr. Pershing Proud.  Patient's son at bedside throughout procedure.  Reassurance given to patient.  Patient tolerated well.  Wound cleansed with chlorhexadine wash and dressed with DSD and bandage wrap.

## 2018-01-04 NOTE — ED Triage Notes (Signed)
Lucila Maine brings patient from assisted living. Noted lump on back of head. Per note patient had lump drained one week agod but without a drain placed. Now has become lump again.

## 2018-01-04 NOTE — ED Notes (Signed)
AAOx3.  Skin warm and dry.  NAD 

## 2018-02-16 ENCOUNTER — Encounter: Payer: Self-pay | Admitting: Nurse Practitioner

## 2018-02-16 ENCOUNTER — Non-Acute Institutional Stay: Payer: Medicare Other | Admitting: Nurse Practitioner

## 2018-02-16 VITALS — HR 82 | Temp 97.9°F | Resp 18 | Wt 107.0 lb

## 2018-02-16 DIAGNOSIS — R413 Other amnesia: Secondary | ICD-10-CM

## 2018-02-16 DIAGNOSIS — Z515 Encounter for palliative care: Secondary | ICD-10-CM

## 2018-02-16 DIAGNOSIS — R630 Anorexia: Secondary | ICD-10-CM | POA: Insufficient documentation

## 2018-02-16 NOTE — Progress Notes (Signed)
[facility] Telephone: [phone](336) [phone] Fax: [phone](336) [phone]  PATIENT NAME: Norma SanteeMildred L Perez DOB: Oct 06, 1916 MRN: [id number]  PRIMARY CARE PROVIDER:   Almetta Perez, [facility]  REFERRING PROVIDER:  [facility] [address] [place], [place] [id number]  RESPONSIBLE PARTY:   Norma Perez, son Health Care power-of-attorney [phone]  ASSESSMENT:     I visited and observed Norma Perez. We talked about purpose of palliative medicine visit though with cognitive impairment was difficult for her to understand. She talked about going home today and that her father was coming to pick her up. She does appear to be more confused than she was previous palliative medicine visit. I asked if she was having symptoms of pain and she replied "no". I asked if she was hungry but she replied that she just ate. She was cooperative with assessment. Limited verbal discussion due to cognitive impairment and very hard of hearing. Emotional support provided. She has had a weight gain, with improved appetite. No new changes to current medical goals of care with Focus to remain on Comfort. DNR remains in place. Updated nursing staff new changes today.  I called and talked to Norma Perez's son, Norma Perez Health Care power-of-attorney. Talked about purpose for palliative medicine visit and he was an agreement. Talked about visit with mystery and clinical update discussed. We talked about at present time she does remain stable though she is 84 years old. We talked about chronic disease progression with memory loss and expectations with progression. We talked about appetite and weight gain. We talked about role palliative medicine and plan of care with focus on Comfort. DNR already in place.    RECOMMENDATIONS and PLAN:  1. Anorexia R63.0 secondary to protein calorie malnutrition improving reflective of weight gain. Continue to monitor daily weights, supplements, supportive measures and courage  to eat  2. Memory loss R41.3 appears progressive. Medical goals to continue to focus on Comfort, redirecting with supportive measures.  3. Palliative care encounter Z51.5 continue supportive measures.  Discussed will follow up with palliative medicine visit in two months if needed her sooner should she decline. Norma Perez in agreement.  I spent 75 minutes providing this consultation,  From 9:00am to 10:15am. More than 50% of the time in this consultation was spent coordinating communication.   HISTORY OF PRESENT ILLNESS:  Norma Perez is a 84 y.o. year old with multiple medical problems including protein calorie malnutrition, hard of hearing, abdominal hysterectomy. Last hospitalization 05/2017 for pneumonia and acute hypoxic respiratory failure improved with antibiotic therapy. She resides in assisted living facility. She does require assistance for Mobility as she is able to walk with a walker with assistance. She has difficulty with ADLs and requires assistance. She does feed herself and appetite has been Fair. She is forgetful per staff and very hard of hearing. Staff reports no recent wounds common infections. She does go to the locked memory care unit during the day for activities and monitoring. 9 / 9 / 2019 visit to ED secondary to acute cystitis require antibiotic therapy. 9 / 11 / 2018 visit to ED secondary to a fall with no noted injury in CT head and neck negative. 10 / 21 / 2019 visit to ED secondary to abscess requiring I&D antibiotic therapy. Last palliative care visit 10 / 15 / 2019 for symptom management of decreased appetite and chronic disease progression of protein calorie malnutrition. At present she is sitting in the wheelchair in the common area. She appears  thin, elderly but comfortable. No visitors present. Palliative Care was asked to help address goals of care.   9 / 9 / 2019 sodium 137, potassium 4.2, chloride 104, Co2 25, calcium 8.1, bun 15, creatinine 0.46, glucose 116,  albumin 3.2, total protein 6.5, WBC 12.6, hemoglobin 12.1, hematocrit 33.9, platelets 166  CODE STATUS: DNR  PPS: 40% HOSPICE ELIGIBILITY/DIAGNOSIS: NO  PAST MEDICAL HISTORY:  Past Medical History:  Diagnosis Date  . Hard of hearing   . Memory loss     SOCIAL HX:  Social History   Tobacco Use  . Smoking status: Never Smoker  . Smokeless tobacco: Never Used  Substance Use Topics  . Alcohol use: Never    Frequency: Never    ALLERGIES: No Known Allergies   PERTINENT MEDICATIONS:  Outpatient Encounter Medications as of 02/16/2018  Medication Sig  . acetaminophen (TYLENOL) 500 MG tablet Take 1,000 mg by mouth every 8 (eight) hours as needed for mild pain or fever.  . cyanocobalamin 1000 MCG tablet Take 1,000 mcg by mouth daily.  . Emollient (CERAVE) CREA Apply 1 application topically 2 (two) times daily. Apply to bilateral lower extremities  . mirtazapine (REMERON) 15 MG tablet Take 15 mg by mouth at bedtime.  . Skin Protectants, Misc. (ENDIT EX) Apply 1 application topically 3 (three) times daily.  Marland Kitchen triamcinolone cream (KENALOG) 0.1 % Apply 1 application topically 2 (two) times daily as needed (itching).   No facility-administered encounter medications on file as of 02/16/2018.     PHYSICAL EXAM:   General: NAD, frail appearing, thin Cardiovascular: regular rate and rhythm Pulmonary: clear ant fields Abdomen: soft, nontender, + bowel sounds GU: no suprapubic tenderness Extremities: no edema, no joint deformities Skin: no rashes Neurological: Weakness but otherwise nonfocal   Prince Rome, NP

## 2018-04-13 ENCOUNTER — Encounter: Payer: Self-pay | Admitting: Nurse Practitioner

## 2018-04-13 ENCOUNTER — Non-Acute Institutional Stay: Payer: Medicare Other | Admitting: Nurse Practitioner

## 2018-04-13 VITALS — HR 68 | Resp 20 | Wt 113.0 lb

## 2018-04-13 DIAGNOSIS — R413 Other amnesia: Secondary | ICD-10-CM

## 2018-04-13 DIAGNOSIS — R630 Anorexia: Secondary | ICD-10-CM | POA: Insufficient documentation

## 2018-04-13 DIAGNOSIS — Z515 Encounter for palliative care: Secondary | ICD-10-CM

## 2018-04-13 NOTE — Progress Notes (Signed)
[facility] Telephone: [phone](336) [phone] Fax: [phone]) [phone]  PATIENT NAME: Norma SanteeMildred L Perez DOB: 1916/07/01 MRN: [id number]  PRIMARY CARE PROVIDER:   [facility]  REFERRING PROVIDER:  [facility] RESPONSIBLE PARTY:   Jennette BankerJames Tews, son Health Care power-of-attorney [phone]  RECOMMENDATIONS and PLAN:  1. Palliative care encounter Z51.5 continue supportive measures.  Discussed will follow up with palliative medicine visit in two months if needed her sooner should she decline.  2. Anorexia R63.0 secondary to protein calorie malnutrition improving reflective of weight gain. Continue to monitor daily weights, supplements, supportive measures and courage to eat  3. Memory loss R41.3 appears progressive. Medical goals to continue to focus on Comfort, redirecting with supportive measures.  I spent 60 minutes providing this consultation,  from 2:30pm to 3:30pm. More than 50% of the time in this consultation was spent coordinating communication.   ASSESSMENT:     I visited and observe Norma Perez. Attempted to explain purpose or palliative care visit. She is extremely hard of hearing but will answer basic questions. She denied symptoms of pain. Attempted to talk about her appetite and food with she had difficulty understanding, processing. We talked about her day at the facility. Asked if she was napping and she replied that she was not although when approached she was asleep. She was cooperative during the assessment. Limited verbal discussion due to cognitive impairment, difficulty with hearing. Emotional support provided. She does continue to be stable at present time with weight. Will continue to Monitor and follow with palliative care, continue with treat what is treatable, DNR in place.   02/15/2019 weight 107.0 lb 04/10/2018 weight 113.0 lb  HISTORY OF PRESENT ILLNESS:  Norma SanteeMildred L Perez is a [age] y.o. year old female with multiple medical problems including protein  calorie malnutrition, hard of hearing, abdominal hysterectomy. Norma. Aurther Perez continues to reside at assisted living facility. She does go to the locks memory care unit during the day for activities and closer monitoring. She does ambulate with a walker in assistance. She requires assistance for adl's and toileting. She is extremely hard of hearing. She does feed herself and appetite has been fair for staff. She does have a small wound on the back of her head from a previous fall that continues to reopen at times. Staff endorses no recent hospitalizations or infections. She does remain a DNR. Staff endorses she is been stable and no other changes. At present she is sitting in the chair and lots memory care unit. She is sleeping but did arouse to verbal cues. She appears thin but comfortable. No visitors present. Palliative Care was asked to help address goals of care and re-visit weights.   CODE STATUS: DNR  PPS: 40% HOSPICE ELIGIBILITY/DIAGNOSIS: TBD  PAST MEDICAL HISTORY:  Past Medical History:  Diagnosis Date  . Hard of hearing   . Memory loss   . Palliative care encounter 06/19/2017  . Protein calorie malnutrition (HCC)   . Protein-calorie malnutrition, severe (HCC)     SOCIAL HX:  Social History   Tobacco Use  . Smoking status: Never Smoker  . Smokeless tobacco: Never Used  Substance Use Topics  . Alcohol use: Never    Frequency: Never    ALLERGIES: No Known Allergies   PERTINENT MEDICATIONS:  Outpatient Encounter Medications as of 04/13/2018  Medication Sig  . acetaminophen (TYLENOL) 500 MG tablet Take 1,000 mg by mouth every 8 (eight) hours as needed for mild pain or fever.  . cyanocobalamin 1000 MCG tablet Take 1,000  mcg by mouth daily.  . Emollient (CERAVE) CREA Apply 1 application topically 2 (two) times daily. Apply to bilateral lower extremities  . mirtazapine (REMERON) 15 MG tablet Take 15 mg by mouth at bedtime.  . Skin Protectants, Misc. (ENDIT EX) Apply 1 application  topically 3 (three) times daily.  Marland Kitchen triamcinolone cream (KENALOG) 0.1 % Apply 1 application topically 2 (two) times daily as needed (itching).   No facility-administered encounter medications on file as of 04/13/2018.     PHYSICAL EXAM:   General: NAD, frail appearing, thin elderly, oriented to self female Cardiovascular: regular rate and rhythm Pulmonary: clear ant fields Abdomen: soft, nontender, + bowel sounds GU: no suprapubic tenderness Extremities: no edema, no joint deformities Skin: no rashes Neurological: Weakness but otherwise nonfocal  Norma Howland Prince Rome, NP

## 2018-06-11 ENCOUNTER — Non-Acute Institutional Stay: Payer: Medicare Other | Admitting: Student

## 2018-06-11 ENCOUNTER — Other Ambulatory Visit: Payer: Self-pay

## 2018-06-11 VITALS — BP 130/80 | HR 80 | Wt 118.0 lb

## 2018-06-11 DIAGNOSIS — Z515 Encounter for palliative care: Secondary | ICD-10-CM

## 2018-06-11 NOTE — Progress Notes (Signed)
Therapist, nutritional Palliative Care Consult Note Telephone: [phone]  Fax: [phone]  PATIENT NAME: Norma Perez DOB: 03-Mar-1917 MRN: [id number]  PRIMARY CARE PROVIDER:   Almetta Lovely[facility]  REFERRING PROVIDER:  [facility] [address] [place], [place] [address]  RESPONSIBLE PARTY: Drina Goldsmith, son Health Care power-of-attorney [phone]    ASSESSMENT: Norma Perez is sitting in common area upon arrival. She is escorted to bathroom by staff; she is observed ambulating with walker and returned to common area. NP attempted to explain reason for Palliative Medicine visit. She is hard of hearing; she does answer some direct questions. No acute distress noted. Discussed symptom management; she has been itching and she also has swelling to lower extremities. Her weight is being monitored. Palliative Medicine to continue to provide support, focus on comfort and symptom management. Will monitor for changes and declines.         RECOMMENDATIONS and PLAN:  1. Code status: DNR. 2. Medical goals of therapy: Palliative Medicine will focus on comfort and symptom management. Will monitor for changes and declines. 3. Symptom management: Anorexia-continue nutritional supplement twice daily, weekly weights, monitor intake. Itching-recommendation for benadryl cream 2% applied to affected areas BID prn. Edema-staff is encouraged to apply ted hose daily, remove each evening.  4. Discharge Planning: Norma Perez will continue to reside at [facility].  5. Emotional support: discussed with staff; they are encouraged to call with questions. Will call son Zhanee Rennard to provide an update.  [facility] notified with recommendations.  Palliative Medicine to follow up in 8 weeks or sooner, if needed.   I spent 25 minutes providing this consultation,  from 2:10pm to 2:35pm. More than 50% of the time in this consultation was spent coordinating  communication.   HISTORY OF PRESENT ILLNESS:  CAMYLA ROBARDS is a 85 y.o. female with multiple medical problems including hard of hearing, memory loss, protein calorie malnutrition, major depressive disorder. Palliative Care was asked to help address goals of care; she is seen for follow up visit today. Norma Perez is dependent for all activities of daily living. She is ambulatory with a walker. She has impaired vision and is hard of hearing. She denies pain or shortness of breath. Her appetite varies from poor to fair; she is eating 25-50% of meals per med tech Humphrey. She is also receiving nutritional supplement twice daily. She is weighed daily. She is escorted to locked memory unit each day and is monitored by staff. She is monitored for falls. Staff reports patient napping during the day; no sleep difficulty at night reported. She has been scratching her legs and back per staff; her triamcinolone has been discontinued. Maha reports redness to buttocks; endit cream is being applied. She also has swelling to legs. No other changes or declines reported. No recent emergency room visits or hospitalizations. She is a DNR.  CODE STATUS: DNR  PPS: 40% HOSPICE ELIGIBILITY/DIAGNOSIS: TBD  PAST MEDICAL HISTORY:  Past Medical History:  Diagnosis Date  . Hard of hearing   . Memory loss   . Palliative care encounter 06/19/2017  . Protein calorie malnutrition (HCC)   . Protein-calorie malnutrition, severe (HCC)     SOCIAL HX:  Social History   Tobacco Use  . Smoking status: Never Smoker  . Smokeless tobacco: Never Used  Substance Use Topics  . Alcohol use: Never    Frequency: Never    ALLERGIES: No Known Allergies   PERTINENT MEDICATIONS:  Outpatient Encounter Medications as of 06/11/2018  Medication Sig  . acetaminophen (TYLENOL) 500 MG tablet Take 1,000 mg by mouth every 8 (eight) hours as needed for mild pain or fever.  . cyanocobalamin 1000 MCG tablet Take 1,000 mcg by mouth daily.  .  Emollient (CERAVE) CREA Apply 1 application topically 2 (two) times daily. Apply to bilateral lower extremities  . mirtazapine (REMERON) 15 MG tablet Take 15 mg by mouth at bedtime.  . Skin Protectants, Misc. (ENDIT EX) Apply 1 application topically 3 (three) times daily.  Marland Kitchen triamcinolone cream (KENALOG) 0.1 % Apply 1 application topically 2 (two) times daily as needed (itching).   No facility-administered encounter medications on file as of 06/11/2018.     PHYSICAL EXAM:   General: NAD, frail appearing, thin Cardiovascular: regular rate and rhythm Pulmonary: clear ant fields Abdomen: soft, nontender, + bowel sounds GU: no suprapubic tenderness Extremities: 2+ edema, no joint deformities Skin: irritation to legs,  Neurological: Weakness but otherwise nonfocal  Luella Cook, NP

## 2018-06-14 ENCOUNTER — Telehealth: Payer: Self-pay | Admitting: Student

## 2018-06-14 NOTE — Telephone Encounter (Signed)
NP spoke with patient's son Kella Gloeckner to provide an update on Palliative Medicine visit. He is encouraged to call with questions.

## 2018-10-27 ENCOUNTER — Telehealth: Payer: Self-pay | Admitting: Student

## 2018-10-27 NOTE — Telephone Encounter (Signed)
Palliative NP spoke with facility nurse Rankin. She denies patient having any needs. Offered zoom visit as facility is limiting outside visitors; she declines visit at this time. She is encouraged to call with any needs or concerns.

## 2019-02-15 ENCOUNTER — Emergency Department: Payer: Medicare Other

## 2019-02-15 ENCOUNTER — Encounter: Payer: Self-pay | Admitting: Emergency Medicine

## 2019-02-15 ENCOUNTER — Emergency Department
Admission: EM | Admit: 2019-02-15 | Discharge: 2019-02-15 | Disposition: A | Discharge status: Home / Self Care | Payer: Medicare Other | Attending: Student in an Organized Health Care Education/Training Program | Admitting: Student in an Organized Health Care Education/Training Program

## 2019-02-15 ENCOUNTER — Other Ambulatory Visit: Payer: Self-pay

## 2019-02-15 DIAGNOSIS — W19XXXA Unspecified fall, initial encounter: Secondary | ICD-10-CM

## 2019-02-15 DIAGNOSIS — Z79899 Other long term (current) drug therapy: Secondary | ICD-10-CM | POA: Diagnosis not present

## 2019-02-15 DIAGNOSIS — N390 Urinary tract infection, site not specified: Secondary | ICD-10-CM

## 2019-02-15 DIAGNOSIS — R410 Disorientation, unspecified: Secondary | ICD-10-CM | POA: Diagnosis present

## 2019-02-15 LAB — URINALYSIS, COMPLETE (UACMP) WITH MICROSCOPIC
Bilirubin Urine: NEGATIVE
Glucose, UA: NEGATIVE mg/dL
Hgb urine dipstick: NEGATIVE
Ketones, ur: NEGATIVE mg/dL
Nitrite: POSITIVE — AB
Protein, ur: 30 mg/dL — AB
Specific Gravity, Urine: 1.02 (ref 1.005–1.030)
Squamous Epithelial / HPF: NONE SEEN (ref 0–5)
pH: 6 (ref 5.0–8.0)

## 2019-02-15 MED ORDER — CEFTRIAXONE SODIUM 1 G IJ SOLR
1.0000 g | Freq: Once | INTRAMUSCULAR | Status: AC
  Administered 2019-02-15: 1 g via INTRAMUSCULAR
  Filled 2019-02-15: qty 10

## 2019-02-15 MED ORDER — CEFUROXIME AXETIL 500 MG PO TABS: 500.0000 mg | ORAL_TABLET | Freq: Two times a day (BID) | ORAL | 0 refills | Status: AC

## 2019-02-15 MED ORDER — LIDOCAINE HCL (PF) 1 % IJ SOLN
5.0000 mL | Freq: Once | INTRAMUSCULAR | Status: AC
  Administered 2019-02-15: 5 mL via INTRADERMAL
  Filled 2019-02-15: qty 5

## 2019-02-15 NOTE — ED Provider Notes (Signed)
[facility] Emergency Department Provider Note    First MD Initiated Contact with Patient 02/15/19 1515     (approximate)  I have reviewed the triage vital signs and the nursing notes.   HISTORY  Chief Complaint Fall  Level V Caveat:  HOH, memory loss, poor historian  HPI Norma Perez is a 85 y.o. female presents to the ER for evaluation of unwitnessed fall.  Patient very poor historian unable to provide much additional history but does not appear to be any pain.  Does have knot to the right posterior scalp without evidence of laceration or bleeding.  No pain of her neck.  Discussed presentation with the patient's son who states that they do want to limit the diagnostic testing particularly needlesticks.  Does agree to imaging as well as urine study but is declining lab work at this time.  It does appear that she is DNR and goals of care are comfort.    Past Medical History:  Diagnosis Date  . Hard of hearing   . Memory loss   . Palliative care encounter 06/19/2017  . Protein calorie malnutrition ([place])   . Protein-calorie malnutrition, severe ([place])    No family history on file. Past Surgical History:  Procedure Laterality Date  . ABDOMINAL HYSTERECTOMY     Patient Active Problem List   Diagnosis Date Noted  . Memory deficit 04/13/2018  . Decreased appetite 04/13/2018  . Palliative care encounter 02/16/2018  . Memory loss 02/16/2018  . Anorexia 02/16/2018  . Protein-calorie malnutrition, severe 06/10/2017  . Pneumonia 06/08/2017      Prior to Admission medications   Medication Sig Start Date End Date Taking? Authorizing Provider  acetaminophen (TYLENOL) 500 MG tablet Take 1,000 mg by mouth every 8 (eight) hours as needed for mild pain or fever.    [provider]  cefUROXime (CEFTIN) 500 MG tablet Take 1 tablet (500 mg total) by mouth 2 (two) times daily for 7 days. 02/15/19 02/22/19  Merlyn Lot, MD  cyanocobalamin 1000 MCG  tablet Take 1,000 mcg by mouth daily.    [provider]  Emollient (CERAVE) CREA Apply 1 application topically 2 (two) times daily. Apply to bilateral lower extremities    [provider]  Melatonin 3 MG TABS Take 3 mg by mouth.    [provider]  mirtazapine (REMERON) 15 MG tablet Take 15 mg by mouth at bedtime.    [provider]  polyethylene glycol (MIRALAX / GLYCOLAX) packet Take 17 g by mouth daily as needed.    [provider]  Skin Protectants, Misc. (ENDIT EX) Apply 1 application topically 3 (three) times daily.    [provider]  traZODone (DESYREL) 50 MG tablet Take 50 mg by mouth at bedtime. Give 0.5 tablet.    [provider]    Allergies Patient has no known allergies.    Social History Social History   Tobacco Use  . Smoking status: Never Smoker  . Smokeless tobacco: Never Used  Substance Use Topics  . Alcohol use: Never    Frequency: Never  . Drug use: Never    Review of Systems Patient denies headaches, rhinorrhea, blurry vision, numbness, shortness of breath, chest pain, edema, cough, abdominal pain, nausea, vomiting, diarrhea, dysuria, fevers, rashes or hallucinations unless otherwise stated above in HPI. ____________________________________________   PHYSICAL EXAM:  VITAL SIGNS: Vitals:   02/15/19 1515  BP: (!) 164/79  Pulse: 79  Resp: 16  Temp: 98.9 F (37.2  C)  SpO2: 100%    Constitutional: Alert, very HOH,  Eyes: Conjunctivae are normal.  Head: Atraumatic. Nose: No congestion/rhinnorhea. Mouth/Throat: Mucous membranes are moist.   Neck: No stridor. Painless ROM.  Cardiovascular: Normal rate, regular rhythm. Grossly normal heart sounds.  Good peripheral circulation. Respiratory: Normal respiratory effort.  No retractions. Lungs CTAB. Gastrointestinal: Soft and nontender. No distention. No abdominal bruits. No CVA tenderness. Genitourinary: deferred Musculoskeletal: No lower  extremity tenderness nor edema.  No pain with full ROM of BLE and UE.  No joint effusions. Neurologic:   Exam limited 2/2 HOH,  MAE spontaneously, No gross focal neurologic deficits are appreciated.  Skin:  Skin is warm, dry and intact. No rash noted. Psychiatric: calm and cooperative  ____________________________________________   LABS (all labs ordered are listed, but only abnormal results are displayed)  Results for orders placed or performed during the hospital encounter of 02/15/19 (from the past 24 hour(s))  Urinalysis, Complete w Microscopic     Status: Abnormal   Collection Time: 02/15/19  3:47 PM  Result Value Ref Range   Color, Urine YELLOW (A) YELLOW   APPearance HAZY (A) CLEAR   Specific Gravity, Urine 1.020 1.005 - 1.030   pH 6.0 5.0 - 8.0   Glucose, UA NEGATIVE NEGATIVE mg/dL   Hgb urine dipstick NEGATIVE NEGATIVE   Bilirubin Urine NEGATIVE NEGATIVE   Ketones, ur NEGATIVE NEGATIVE mg/dL   Protein, ur 30 (A) NEGATIVE mg/dL   Nitrite POSITIVE (A) NEGATIVE   Leukocytes,Ua SMALL (A) NEGATIVE   RBC / HPF 6-10 0 - 5 RBC/hpf   WBC, UA 21-50 0 - 5 WBC/hpf   Bacteria, UA MANY (A) NONE SEEN   Squamous Epithelial / LPF NONE SEEN 0 - 5   Mucus PRESENT    ____________________________________________  EKG My review and personal interpretation at Time: 15:20   Indication: fall  Rate: 80  Rhythm: sinus Axis: normal Other: poor r wave progression, nonspecific st abn ____________________________________________  RADIOLOGY  I personally reviewed all radiographic images ordered to evaluate for the above acute complaints and reviewed radiology reports and findings.  These findings were personally discussed with the patient.  Please see medical record for radiology report.  ____________________________________________   PROCEDURES  Procedure(s) performed:  Procedures    Critical Care performed: no ____________________________________________   INITIAL IMPRESSION /  ASSESSMENT AND PLAN / ED COURSE  Pertinent labs & imaging results that were available during my care of the patient were reviewed by me and considered in my medical decision making (see chart for details).   DDX: sdh, iph, cva, fracture, uti, dehydration, electrolyte abn  Otelia SanteeMildred L Hensarling is a [age] y.o. who presents to the ED with fall and some confusion as described above.  She arrives in no acute distress.  Not complaining of any discomfort but very poor historian very hard of hearing.  Spoke with patient's son regarding her goals of care.  Plan imaging check urine and reassess.  Clinical Course as of Feb 14 1618  Tue Feb 15, 2019  1616 Patient does have evidence of urinary tract infection.  Daughter-in-law at bedside also agrees with plan for antibiotics and discharge back to facility.  Patient remains in no acute distress.     [PR]    Clinical Course User Index [PR] Willy Eddyobinson, Shylynn Bruning, MD    The patient was evaluated in Emergency Department today for the symptoms described in the history of present illness. He/she was evaluated in the context of the global COVID-19 pandemic,  which necessitated consideration that the patient might be at risk for infection with the SARS-CoV-2 virus that causes COVID-19. Institutional protocols and algorithms that pertain to the evaluation of patients at risk for COVID-19 are in a state of rapid change based on information released by regulatory bodies including the CDC and federal and state organizations. These policies and algorithms were followed during the patient's care in the ED.  As part of my medical decision making, I reviewed the following data within the electronic MEDICAL RECORD NUMBER Nursing notes reviewed and incorporated, Labs reviewed, notes from prior ED visits and [place] Controlled Substance Database   ____________________________________________   FINAL CLINICAL IMPRESSION(S) / ED DIAGNOSES  Final diagnoses:  Fall, initial encounter  Lower  urinary tract infectious disease      NEW MEDICATIONS STARTED DURING THIS VISIT:  New Prescriptions   CEFUROXIME (CEFTIN) 500 MG TABLET    Take 1 tablet (500 mg total) by mouth 2 (two) times daily for 7 days.     Note:  This document was prepared using Dragon voice recognition software and may include unintentional dictation errors.    Willy Eddy, MD 02/15/19 [phone]

## 2019-02-15 NOTE — ED Notes (Signed)
Patient transported to X-ray 

## 2019-02-15 NOTE — ED Notes (Signed)
Pt given to daughter in law to take back to facility. PT in NAD

## 2019-02-15 NOTE — ED Notes (Signed)
ED Provider at bedside. 

## 2019-02-15 NOTE — ED Triage Notes (Signed)
Pt from [facility], c/o unwitnessed fall, found in floor. Pt has no known injuries. Pt not on any anticoags. Per staff pt has also been " wandering " in the hallways and has possibly UTI. Staff states pt has been confused the past few days. Pt in NAD, VS

## 2019-03-02 ENCOUNTER — Other Ambulatory Visit: Payer: Self-pay

## 2019-03-02 ENCOUNTER — Non-Acute Institutional Stay: Payer: Medicare Other | Admitting: Adult Health Nurse Practitioner

## 2019-03-02 DIAGNOSIS — Z515 Encounter for palliative care: Secondary | ICD-10-CM

## 2019-03-02 DIAGNOSIS — R413 Other amnesia: Secondary | ICD-10-CM

## 2019-03-02 NOTE — Progress Notes (Signed)
    [facility] Consult Note Telephone: [phone]  Fax: [phone]  PATIENT NAME: Norma Perez DOB: 1917/03/03 MRN: [id number]  PRIMARY CARE PROVIDER:   [facility]  REFERRING PROVIDER:  [facility] [address],  Dennis Perez  RESPONSIBLE PARTY:     Due to the COVID-19 crisis, this telephone evaluation and treatment contact was done via telephone and it was initiated and consent by this patient and or family.    RECOMMENDATIONS and PLAN:  1.  Advanced care planning.  Patient is DNR.  2. Had a quick telephone visit with facility staff who had no new concerns.  Talked with son and he had no new concerns or questions.  Documentation review did show that patient had an ED visit on 02/15/2019 for a fall without injury and was found to have a UTI and was treated for this.    Continue current plan of care and palliative will continue to monitor for symptom management and/or decline and will make recommendations as needed  I spent 15 minutes providing this consultation,  from 9:30 to 9:45. More than 50% of the time in this consultation was spent coordinating communication.   HISTORY OF PRESENT ILLNESS:  Norma Perez is a 85 y.o. year old female with multiple medical problems including hard of hearing, memory loss, protein calorie malnutrition, major depressive disorder. Palliative Care was asked to help address goals of care.   CODE STATUS: DNR  PPS: 40% HOSPICE ELIGIBILITY/DIAGNOSIS: TBD  PHYSICAL EXAM:   Deferred  PAST MEDICAL HISTORY:  Past Medical History:  Diagnosis Date  . Hard of hearing   . Memory loss   . Palliative care encounter 06/19/2017  . Protein calorie malnutrition ([place])   . Protein-calorie malnutrition, severe ([place])     SOCIAL HX:  Social History   Tobacco Use  . Smoking status: Never Smoker  . Smokeless tobacco: Never Used  Substance Use Topics    . Alcohol use: Never    ALLERGIES: No Known Allergies   PERTINENT MEDICATIONS:  Outpatient Encounter Medications as of 03/02/2019  Medication Sig  . acetaminophen (TYLENOL) 500 MG tablet Take 1,000 mg by mouth every 8 (eight) hours as needed for mild pain or fever.  . cyanocobalamin 1000 MCG tablet Take 1,000 mcg by mouth daily.  . Emollient (CERAVE) CREA Apply 1 application topically 2 (two) times daily. Apply to bilateral lower extremities  . Melatonin 3 MG TABS Take 3 mg by mouth.  . mirtazapine (REMERON) 15 MG tablet Take 15 mg by mouth at bedtime.  . polyethylene glycol (MIRALAX / GLYCOLAX) packet Take 17 g by mouth daily as needed.  . Skin Protectants, Misc. (ENDIT EX) Apply 1 application topically 3 (three) times daily.  . traZODone (DESYREL) 50 MG tablet Take 50 mg by mouth at bedtime. Give 0.5 tablet.   No facility-administered encounter medications on file as of 03/02/2019.      Keeon Zurn Jenetta Downer, NP

## 2019-05-24 ENCOUNTER — Other Ambulatory Visit: Payer: Self-pay

## 2019-05-24 ENCOUNTER — Emergency Department
Admission: EM | Admit: 2019-05-24 | Discharge: 2019-05-24 | Disposition: A | Discharge status: Home / Self Care | Payer: Medicare Other | Attending: Emergency Medicine | Admitting: Emergency Medicine

## 2019-05-24 DIAGNOSIS — Z79899 Other long term (current) drug therapy: Secondary | ICD-10-CM | POA: Diagnosis not present

## 2019-05-24 DIAGNOSIS — E860 Dehydration: Secondary | ICD-10-CM | POA: Diagnosis not present

## 2019-05-24 DIAGNOSIS — R7989 Other specified abnormal findings of blood chemistry: Secondary | ICD-10-CM | POA: Diagnosis present

## 2019-05-24 DIAGNOSIS — E876 Hypokalemia: Secondary | ICD-10-CM | POA: Diagnosis not present

## 2019-05-24 LAB — CBC WITH DIFFERENTIAL/PLATELET
Abs Immature Granulocytes: 0.07 10*3/uL (ref 0.00–0.07)
Basophils Absolute: 0 10*3/uL (ref 0.0–0.1)
Basophils Relative: 0 %
Eosinophils Absolute: 0.4 10*3/uL (ref 0.0–0.5)
Eosinophils Relative: 4 %
HCT: 37.5 % (ref 36.0–46.0)
Hemoglobin: 12.3 g/dL (ref 12.0–15.0)
Immature Granulocytes: 1 %
Lymphocytes Relative: 14 %
Lymphs Abs: 1.3 10*3/uL (ref 0.7–4.0)
MCH: 31.7 pg (ref 26.0–34.0)
MCHC: 32.8 g/dL (ref 30.0–36.0)
MCV: 96.6 fL (ref 80.0–100.0)
Monocytes Absolute: 0.8 10*3/uL (ref 0.1–1.0)
Monocytes Relative: 8 %
Neutro Abs: 7.2 10*3/uL (ref 1.7–7.7)
Neutrophils Relative %: 73 %
Platelets: 210 10*3/uL (ref 150–400)
RBC: 3.88 MIL/uL (ref 3.87–5.11)
RDW: 13.3 % (ref 11.5–15.5)
WBC: 9.8 10*3/uL (ref 4.0–10.5)
nRBC: 0 % (ref 0.0–0.2)

## 2019-05-24 LAB — COMPREHENSIVE METABOLIC PANEL
ALT: 15 U/L (ref 0–44)
AST: 19 U/L (ref 15–41)
Albumin: 3.1 g/dL — ABNORMAL LOW (ref 3.5–5.0)
Alkaline Phosphatase: 58 U/L (ref 38–126)
Anion gap: 12 (ref 5–15)
BUN: 41 mg/dL — ABNORMAL HIGH (ref 8–23)
CO2: 29 mmol/L (ref 22–32)
Calcium: 9.1 mg/dL (ref 8.9–10.3)
Chloride: 102 mmol/L (ref 98–111)
Creatinine, Ser: 0.65 mg/dL (ref 0.44–1.00)
GFR calc Af Amer: >60 mL/min (ref >60)
GFR calc non Af Amer: >60 mL/min (ref >60)
Glucose, Bld: 141 mg/dL — ABNORMAL HIGH (ref 70–99)
Potassium: 2.8 mmol/L — ABNORMAL LOW (ref 3.5–5.1)
Sodium: 143 mmol/L (ref 135–145)
Total Bilirubin: 0.8 mg/dL (ref 0.3–1.2)
Total Protein: 6.8 g/dL (ref 6.5–8.1)

## 2019-05-24 MED ORDER — SODIUM CHLORIDE 0.9 % IV SOLN
1.0000 g | Freq: Once | INTRAVENOUS | Status: AC
  Administered 2019-05-24: 1 g via INTRAVENOUS
  Filled 2019-05-24: qty 10

## 2019-05-24 MED ORDER — SODIUM CHLORIDE 0.9 % IV BOLUS
500.0000 mL | Freq: Once | INTRAVENOUS | Status: AC
  Administered 2019-05-24: 500 mL via INTRAVENOUS

## 2019-05-24 MED ORDER — POTASSIUM CHLORIDE 20 MEQ PO PACK
40.0000 meq | PACK | Freq: Once | ORAL | Status: AC
  Administered 2019-05-24: 40 meq via ORAL
  Filled 2019-05-24: qty 2

## 2019-05-24 MED ORDER — POTASSIUM CHLORIDE 10 MEQ/100ML IV SOLN
10.0000 meq | INTRAVENOUS | Status: AC
  Administered 2019-05-24 (×2): 10 meq via INTRAVENOUS
  Filled 2019-05-24 (×2): qty 100

## 2019-05-24 NOTE — Discharge Instructions (Addendum)
Ms. Norma Perez labs did show dehydration today.  We have given her 1L of fluid and potassium replacement.  Please encourage PO fluids and hydration.  Continue ABX for her UTI (she is already on macrobid).  Please have repeat labs checked in 3-5 days

## 2019-05-24 NOTE — ED Notes (Signed)
Pt does not want to be stuck. EDP made aware.

## 2019-05-24 NOTE — ED Triage Notes (Signed)
Pt arrived via [facility] from [facility]. Provider there stated that her blood work showed that she was dehydrated. Pt is extremely HOH.

## 2019-05-24 NOTE — ED Notes (Signed)
Pt given crackers and a beverage at bedside.

## 2019-05-24 NOTE — ED Provider Notes (Signed)
[facility] Emergency Department Provider Note  ____________________________________________   First MD Initiated Contact with Patient 05/24/19 1622     (approximate)  I have reviewed the triage vital signs and the nursing notes.   HISTORY  Chief Complaint Dehydration    HPI Norma Perez is a 86 y.o. female  With h/o malnutrition, memory loss, poor hearing, here with reported dehydration. Per report, pt just had lab work done by [employer], was told to come to ED for fluids by MD today. Unclear whether this was routine testing or symptomatic. She has reportedly not been eating as much lately, though per records this seems to be an ongoing issue. On my interview, pt reports she feels "just fine" and denies complaints. History is limited, however, due to extremely limited hearing.  Level 5 caveat invoked as remainder of history, ROS, and physical exam limited due to patient's memory impairment, poor hearing.         Past Medical History:  Diagnosis Date  . Hard of hearing   . Memory loss   . Palliative care encounter 06/19/2017  . Protein calorie malnutrition ([place])   . Protein-calorie malnutrition, severe [facility])     Patient Active Problem List   Diagnosis Date Noted  . Memory deficit 04/13/2018  . Decreased appetite 04/13/2018  . Palliative care encounter 02/16/2018  . Memory loss 02/16/2018  . Anorexia 02/16/2018  . Protein-calorie malnutrition, severe 06/10/2017  . Pneumonia 06/08/2017    Past Surgical History:  Procedure Laterality Date  . ABDOMINAL HYSTERECTOMY      Prior to Admission medications   Medication Sig Start Date End Date Taking? Authorizing Provider  acetaminophen (TYLENOL) 500 MG tablet Take 500 mg by mouth every 8 (eight) hours as needed for mild pain or fever.    Yes [provider]  amitriptyline (ELAVIL) 10 MG tablet Take 10 mg by mouth at bedtime.   Yes [provider]    cyanocobalamin 1000 MCG tablet Take 1,000 mcg by mouth every other day.    Yes [provider]  Emollient (CERAVE) CREA Apply 1 application topically daily. Apply to legs and back   Yes [provider]  ketoconazole (NIZORAL) 2 % cream Apply 1 application topically in the morning and at bedtime. Apply to right breast (red patch)   Yes [provider]  loperamide (IMODIUM) 2 MG capsule Take by mouth every 8 (eight) hours as needed for diarrhea or loose stools.   Yes [provider]  Melatonin 3 MG TABS Take 3 mg by mouth at bedtime as needed (insomnia).    Yes [provider]  mirtazapine (REMERON) 15 MG tablet Take 15 mg by mouth at bedtime.   Yes [provider]  nitrofurantoin, macrocrystal-monohydrate, (MACROBID) 100 MG capsule Take 100 mg by mouth 2 (two) times daily. 05/17/19 05/24/19 Yes [provider]  Nutritional Supplements (NUTRITIONAL DRINK PO) Take 237 mLs by mouth 3 (three) times daily.   Yes [provider]  polyethylene glycol (MIRALAX / GLYCOLAX) packet Take 17 g by mouth daily as needed.   Yes [provider]  Skin Protectants, Misc. (ENDIT EX) Apply 1 application topically 3 (three) times daily.   Yes [provider]  traZODone (DESYREL) 50 MG tablet Take 25 mg by mouth every other day.  05/24/19 05/30/19 Yes [provider]  traZODone (DESYREL) 50 MG tablet Take 25 mg by mouth every 6 (six) hours as needed (anxiety).   Yes [provider]  triamcinolone cream (KENALOG) 0.1 % Apply 1 application topically 2 (two) times daily. Apply to back/buttock/hip   Yes [provider]    Allergies Patient has no known allergies.  No family history on file.  Social History Social History   Tobacco Use  . Smoking status: Never Smoker  . Smokeless tobacco: Never Used  Substance Use Topics  . Alcohol use: Never  . Drug use: Never    Review of Systems  Review of Systems   Constitutional: Negative for fatigue and fever.  HENT: Negative for congestion and sore throat.   Eyes: Negative for visual disturbance.  Respiratory: Negative for cough and shortness of breath.   Cardiovascular: Negative for chest pain.  Gastrointestinal: Negative for abdominal pain, diarrhea, nausea and vomiting.  Genitourinary: Negative for flank pain.  Musculoskeletal: Negative for back pain and neck pain.  Skin: Negative for rash and wound.  Neurological: Negative for weakness.     ____________________________________________  PHYSICAL EXAM:      VITAL SIGNS: ED Triage Vitals  Enc Vitals Group     BP 05/24/19 1624 (!) 158/82     Pulse Rate 05/24/19 1624 85     Resp 05/24/19 1624 16     Temp 05/24/19 1624 (!) 97.4 F (36.3 C)     Temp Source 05/24/19 1624 Oral     SpO2 05/24/19 1624 98 %     Weight 05/24/19 1622 120 lb (54.4 kg)     Height 05/24/19 1622 5\' 4"  (1.626 m)     Head Circumference --      Peak Flow --      Pain Score 05/24/19 1622 0     Pain Loc --      Pain Edu? --      Excl. in GC? --      Physical Exam Vitals and nursing note reviewed.  Constitutional:      General: She is not in acute distress.    Appearance: She is well-developed.  HENT:     Head: Normocephalic and atraumatic.     Mouth/Throat:     Mouth: Mucous membranes are dry.  Eyes:     Conjunctiva/sclera: Conjunctivae normal.  Cardiovascular:     Rate and Rhythm: Normal rate and regular rhythm.     Heart sounds: Normal heart sounds. No murmur. No friction rub.  Pulmonary:     Effort: Pulmonary effort is normal. No respiratory distress.     Breath sounds: Normal breath sounds. No wheezing or rales.  Abdominal:     General: There is no distension.     Palpations: Abdomen is soft.     Tenderness: There is no abdominal tenderness.  Musculoskeletal:     Cervical back: Neck supple.  Skin:    General: Skin is warm.     Capillary Refill: Capillary refill takes less than 2 seconds.   Neurological:     Mental Status: She is alert.     Motor: No abnormal muscle tone.     Comments: Extremely hard of hearing. Follows commands. Oriented to person, not place or time which is baseline. No apparent focal deficits.       ____________________________________________   LABS (all labs ordered are listed, but only abnormal results are displayed)  Labs Reviewed  COMPREHENSIVE METABOLIC PANEL - Abnormal; Notable for the following components:      Result Value   Potassium 2.8 (*)    Glucose, Bld 141 (*)    BUN 41 (*)  Albumin 3.1 (*)    All other components within normal limits  CBC WITH DIFFERENTIAL/PLATELET    ____________________________________________  EKG: None ________________________________________  RADIOLOGY All imaging, including plain films, CT scans, and ultrasounds, independently reviewed by me, and interpretations confirmed via formal radiology reads.  ED MD interpretation:   NOne  Official radiology report(s): No results found.  ____________________________________________  PROCEDURES   Procedure(s) performed (including Critical Care):  Procedures  ____________________________________________  INITIAL IMPRESSION / MDM / ASSESSMENT AND PLAN / ED COURSE  As part of my medical decision making, I reviewed the following data within the electronic MEDICAL RECORD NUMBER Nursing notes reviewed and incorporated, Old chart reviewed, Notes from prior ED visits, and [place] Controlled Substance Database       *Norma Perez was evaluated in Emergency Department on 05/24/2019 for the symptoms described in the history of present illness. She was evaluated in the context of the global COVID-19 pandemic, which necessitated consideration that the patient might be at risk for infection with the SARS-CoV-2 virus that causes COVID-19. Institutional protocols and algorithms that pertain to the evaluation of patients at risk for COVID-19 are in a state of rapid change  based on information released by regulatory bodies including the CDC and federal and state organizations. These policies and algorithms were followed during the patient's care in the ED.  Some ED evaluations and interventions may be delayed as a result of limited staffing during the pandemic.*  Clinical Course as of May 24 1850  Tue May 24, 2019  1652 86 yo F here with reported dehydration. H/o chronically poor PO intake and malnutrition. Will check screening labs. History limited 2/2 poor hearing, memory impairment. She denies any complaints on my assessment, however. Abdomen is soft and non-tender.   [CI]  1851 Labs show hypokalemia, significantly elevated BUN:Cr ratio though GFR remains >60. She is already on ABX for UTI and has no signs of sepsis. Pt given fluids, PO and IV K replacement. Will encourage hydration at home. She was also given a dose of Rocephin in event of poor PO intake/not taking meds.   [CI]    Clinical Course User Index [CI] Shaune Pollack, MD    Medical Decision Making:  As above.  ____________________________________________  FINAL CLINICAL IMPRESSION(S) / ED DIAGNOSES  Final diagnoses:  Dehydration  Hypokalemia     MEDICATIONS GIVEN DURING THIS VISIT:  Medications  potassium chloride 10 mEq in 100 mL IVPB (10 mEq Intravenous New Bag/Given 05/24/19 1800)  cefTRIAXone (ROCEPHIN) 1 g in sodium chloride 0.9 % 100 mL IVPB (1 g Intravenous New Bag/Given 05/24/19 1837)  sodium chloride 0.9 % bolus 500 mL (0 mLs Intravenous Stopped 05/24/19 1735)  sodium chloride 0.9 % bolus 500 mL (500 mLs Intravenous New Bag/Given 05/24/19 1750)  potassium chloride (KLOR-CON) packet 40 mEq (40 mEq Oral Given 05/24/19 1750)     ED Discharge Orders    None       Note:  This document was prepared using Dragon voice recognition software and may include unintentional dictation errors.   Shaune Pollack, MD 05/24/19 [phone]

## 2019-10-16 DEATH — deceased

## 2020-05-31 IMAGING — CR DG CHEST 1V
1 series · 1 of 1 positions shown · non-contrast
Comparison: Chest x-ray 06/08/2017.

CLINICAL DATA: [AGE] female with history of altered mental
status. Unwitnessed fall today. Confusion.

EXAM:
CHEST  1 VIEW

[dg chest 1 view]
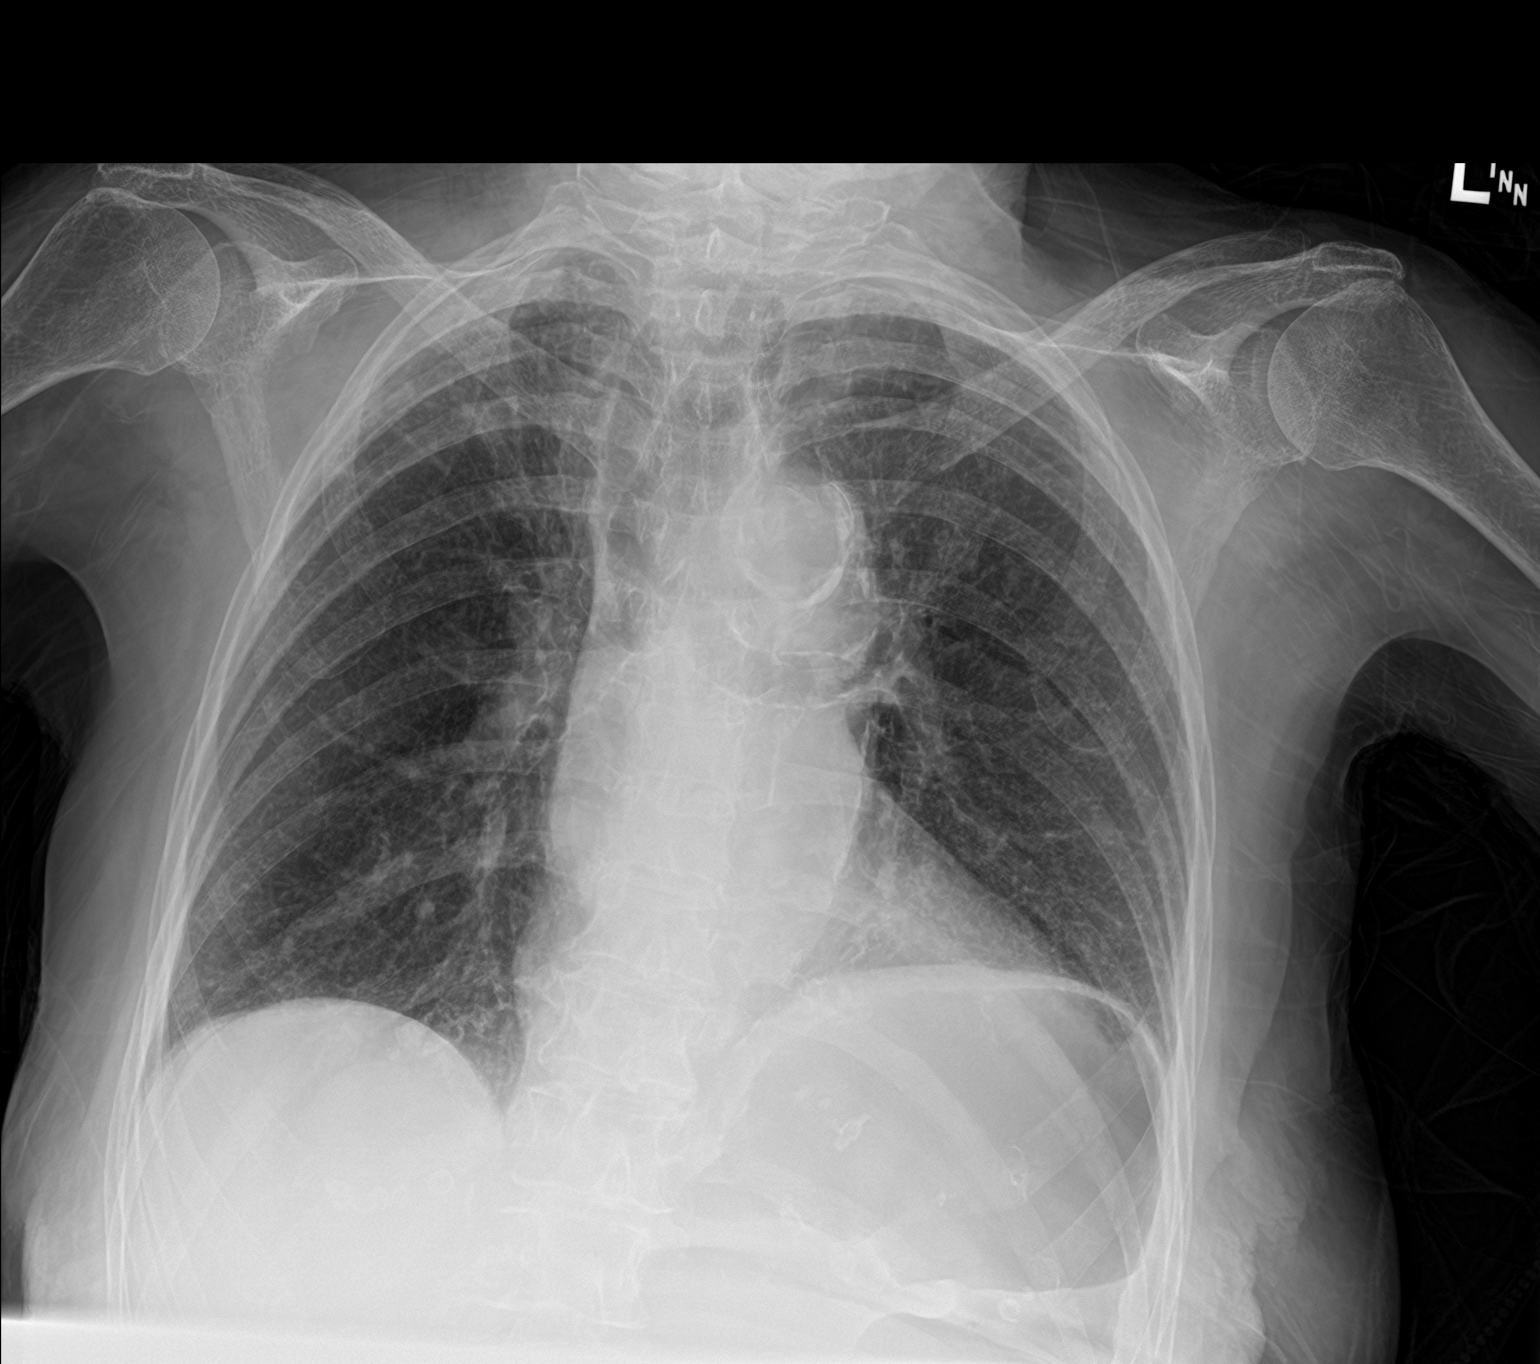

[1 of 1 positions shown; findings below may reference images not displayed]

FINDINGS: Chronic elevation of the left hemidiaphragm. Lung volumes are low.
No consolidative airspace disease. No pleural effusions. No
pneumothorax. No pulmonary nodule or mass noted. Pulmonary
vasculature and the cardiomediastinal silhouette are within normal
limits. Atherosclerosis in the thoracic aorta.
IMPRESSION: 1. Low lung volumes without radiographic evidence of acute
cardiopulmonary disease.
2. Aortic atherosclerosis.
